# Patient Record
Sex: Male | Born: 1945
Health system: Southern US, Community
[De-identification: ages and names within clinical notes are randomized; demographics above are authoritative.]

## PROBLEM LIST (undated history)

## (undated) DIAGNOSIS — E119 Type 2 diabetes mellitus without complications: Secondary | ICD-10-CM

## (undated) DIAGNOSIS — I680 Cerebral amyloid angiopathy: Secondary | ICD-10-CM

## (undated) DIAGNOSIS — E7800 Pure hypercholesterolemia, unspecified: Secondary | ICD-10-CM

## (undated) DIAGNOSIS — I10 Essential (primary) hypertension: Secondary | ICD-10-CM

## (undated) HISTORY — PX: NASAL SINUS SURGERY: SHX719

## (undated) HISTORY — PX: HERNIA REPAIR: SHX51

## (undated) HISTORY — DX: Cerebral amyloid angiopathy: I68.0

## (undated) HISTORY — PX: COLONOSCOPY: SHX174

---

## 1998-09-03 ENCOUNTER — Encounter: Admission: RE | Admit: 1998-09-03 | Discharge: 1998-12-02 | Payer: Self-pay | Admitting: *Deleted

## 2001-10-07 ENCOUNTER — Encounter: Payer: Self-pay | Admitting: Emergency Medicine

## 2001-10-07 ENCOUNTER — Inpatient Hospital Stay (HOSPITAL_COMMUNITY): Admission: EM | Admit: 2001-10-07 | Discharge: 2001-10-10 | Payer: Self-pay | Admitting: Emergency Medicine

## 2001-10-09 ENCOUNTER — Encounter: Payer: Self-pay | Admitting: Family Medicine

## 2001-10-09 ENCOUNTER — Encounter: Payer: Self-pay | Admitting: Neurology

## 2002-04-08 ENCOUNTER — Emergency Department (HOSPITAL_COMMUNITY): Admission: EM | Admit: 2002-04-08 | Discharge: 2002-04-08 | Payer: Self-pay | Admitting: *Deleted

## 2002-04-08 ENCOUNTER — Encounter: Payer: Self-pay | Admitting: *Deleted

## 2002-06-26 ENCOUNTER — Ambulatory Visit (HOSPITAL_COMMUNITY): Admission: RE | Admit: 2002-06-26 | Discharge: 2002-06-26 | Payer: Self-pay | Admitting: General Surgery

## 2003-12-01 ENCOUNTER — Emergency Department (HOSPITAL_COMMUNITY): Admission: EM | Admit: 2003-12-01 | Discharge: 2003-12-01 | Payer: Self-pay | Admitting: Emergency Medicine

## 2006-12-28 ENCOUNTER — Emergency Department (HOSPITAL_COMMUNITY): Admission: EM | Admit: 2006-12-28 | Discharge: 2006-12-28 | Payer: Self-pay | Admitting: Emergency Medicine

## 2007-06-19 ENCOUNTER — Ambulatory Visit (HOSPITAL_COMMUNITY): Admission: RE | Admit: 2007-06-19 | Discharge: 2007-06-19 | Payer: Self-pay | Admitting: Family Medicine

## 2009-07-14 ENCOUNTER — Emergency Department (HOSPITAL_COMMUNITY): Admission: EM | Admit: 2009-07-14 | Discharge: 2009-07-14 | Payer: Self-pay | Admitting: Emergency Medicine

## 2009-09-23 ENCOUNTER — Encounter: Admission: RE | Admit: 2009-09-23 | Discharge: 2009-09-23 | Payer: Self-pay | Admitting: Endocrinology

## 2010-11-03 LAB — BASIC METABOLIC PANEL
CO2: 27 mEq/L (ref 19–32)
Creatinine, Ser: 0.99 mg/dL (ref 0.4–1.5)
GFR calc Af Amer: >60 mL/min (ref >60)
Glucose, Bld: 117 mg/dL — ABNORMAL HIGH (ref 70–99)
Potassium: 3.8 mEq/L (ref 3.5–5.1)

## 2010-11-03 LAB — DIFFERENTIAL
Basophils Relative: 0 % (ref 0–1)
Eosinophils Absolute: 0 10*3/uL (ref 0.0–0.7)
Lymphs Abs: 0.8 10*3/uL (ref 0.7–4.0)

## 2010-11-03 LAB — URINALYSIS, ROUTINE W REFLEX MICROSCOPIC
Bilirubin Urine: NEGATIVE
Ketones, ur: NEGATIVE mg/dL
Protein, ur: NEGATIVE mg/dL
Urobilinogen, UA: 0.2 mg/dL (ref 0.0–1.0)
pH: 6.5 (ref 5.0–8.0)

## 2010-11-03 LAB — CBC
MCHC: 34 g/dL (ref 30.0–36.0)
MCV: 91.4 fL (ref 78.0–100.0)
RBC: 4.57 MIL/uL (ref 4.22–5.81)
WBC: 14.2 10*3/uL — ABNORMAL HIGH (ref 4.0–10.5)

## 2010-12-18 NOTE — H&P (Signed)
   NAME:  Scott Garner, Scott Garner NO.:  [id number]   MEDICAL RECORD NO.:  [id number]                  PATIENT TYPE:   LOCATION:                                       FACILITY:   PHYSICIAN:  Dalia Heading, M.D.               DATE OF BIRTH:  1946-04-22   DATE OF ADMISSION:  06/26/2002  DATE OF DISCHARGE:                                HISTORY & PHYSICAL   CHIEF COMPLAINT:  Need for screening colonoscopy.   HISTORY OF PRESENT ILLNESS:  The patient is a 65 year old white male who was  referred for a screening colonoscopy.  He denies any symptoms or problems.  He has never had a colonoscopy.  There is no immediate family history of  colon carcinoma.   PAST MEDICAL HISTORY:  1. Insulin-dependent diabetes mellitus.  2. Hypertension.   PAST SURGICAL HISTORY:  Sinus surgery in the remote past.   CURRENT MEDICATIONS:  Insulin injections, Altace, hydrochlorothiazide, baby  aspirin, vitamin.   ALLERGIES:  BEXTRA.   REVIEW OF SYSTEMS:  The patient denies drinking or smoking.  Denies any  other cardiopulmonary difficulties or bleeding disorders.   PHYSICAL EXAMINATION:  GENERAL:  The patient is a well-developed, well-  nourished white male in no acute distress.  VITAL SIGNS:  He is afebrile and vital signs are stable.  LUNGS:  Clear to auscultation with equal breath sounds bilaterally.  HEART:  Reveals a regular rate and rhythm without S3, S4, or murmurs.  ABDOMEN:  Soft, nontender, nondistended.  No hepatosplenomegaly or masses  are noted.  RECTAL:  Examination was deferred to the procedure.   IMPRESSION:  Need for screening colonoscopy.    PLAN:  The patient is scheduled for a colonoscopy on June 26, 2002.  The  risks and benefits of the procedure including bleeding and perforation were  fully explained to the patient who gave informed consent.                                               Dalia Heading, M.D.    MAJ/MEDQ  D:  06/21/2002  T:   06/21/2002  Job:  [id number]   cc:   Patrica Duel, M.D.  [address], [address]  [place]  [place] [address]  Fax: [phone]

## 2010-12-18 NOTE — Discharge Summary (Signed)
[facility]  Patient:    TARAY, NORMOYLE Visit Number: [id number] MRN: [id number]          Service Type: MED Location: 2A A216 01 Attending Physician:  Kirk Ruths Dictated by:   Karleen Hampshire, M.D. Admit Date:  10/07/2001 Discharge Date: 10/10/2001                             Discharge Summary  DISCHARGE DIAGNOSIS: 1. Transient ischemic attack. 2. Insulin-dependent diabetes mellitus.  HOSPITAL COURSE:  This 65 year old insulin-dependent diabetic who has been very tightly controlled was admitted to the emergency room.  The patient having had a second episode of awakening with a headache and having numbness and phoresis of his right upper and lower extremities with also, some slurred speech.  On the morning of admission he could not walk and he was brought to the emergency room.  Symptoms resolved while in the emergency room with a full neurological recovery.  The patients blood sugars were well controlled.  HIs admission labs showed some normal blood gases and hemoglobin 14.9, PT and PTT were normal.  Sodium was 129, potassium normal at 4.1, BUN and creatinine normal.  The patient due to a second episode was admitted to the floor. Plavix was added to his aspirin.  The patient was seen in consultation by Dr. Beryle Beams, from neurology who felt that this was TI, small vessel disease.  The patient also underwent a echocardiogram and carotid Dopplers which were normal.  MRI and MRA of the brain was normal.  The patients lipid profile was quite impressive with cholesterol being 181, HDL 88, and triglycerides 43.  The patient had no more episodes during his stay and was stable at the time of discharge.  A homocystine level was pending.  He is discharged home on his insulin as well as plavix and aspirin.  He will be followed in the office as needed. Dictated by:   Karleen Hampshire, M.D. Attending Physician:  Kirk Ruths DD:   10/10/01 TD:  10/10/01 Job: [id number]

## 2010-12-18 NOTE — Consult Note (Signed)
[facility]  Patient:    Scott Garner, Scott Garner Visit Number: [id number] MRN: [id number]          Service Type: MED Location: 2A A216 01 Attending Physician:  Hilario Quarry Dictated by:   Beryle Beams, M.D. Proc. Date: 10/08/01 Admit Date:  10/07/2001                            Consultation Report  DISCHARGE DIAGNOSIS: 1. Transient ischemic attack. 2. Likely obstructive sleep syndrome.  The symptomatology seems most consistent with a small vessel lacunar type type TIA, however, the headaches that he has been having is concerning and suggests a large vessel process.  RECOMMENDATION: 1. I agree with the current plans including aspirin and plavix, antiplatelet    combination, echocardiography and carotid duplex Doppler. 2. I would add an MRI/MRA of the brain to assess particularly for intracranial    disease. 3. Would consider ______ use regardless of what his course is. 4. Homocystine level. 5. Consider diagnostic polysomnography.  BRIEF HISTORY:  This is a 65 year old right handed Caucasian gentleman who has a longstanding history of diabetes, and has been treated for diabetes for close to 3 years.  He developed headaches for the past 2 weeks that were quite severe, located in the right periorbital region, not associated with any particular symptoms.  He denies nausea or vomiting or flashing lights.  He does report having episodic black dots.  About 4 days ago he developed acute right sided hemiparesis that was transient and lasted about 15 to 20 minutes. The hemiparesis was moderate in nature and not associated with slurred speech. He had a more severe case of transient right-sided hemiparesis 2 days ago.  He was apparently totally paralyzed on that side.  This lasted for approximately an hour this time associated with marked slurred speech.  Apparently when he has his episodic headaches recently he has had difficulty concentrating and difficulty  saying what he wants to say.  This history is corroborated by his daughter who works with him.  PAST MEDICAL HISTORY:  As stated above.  He has no prior medicals.  No recent surgeries but apparently has had a herniorrhaphy in the past.  MEDICATIONS:  Insulin.  REVIEW OF SYSTEMS:  Denies chest pain, nausea, vomiting, diaphoresis.  Other review of systems are in the history of present illness.  He does endorse a history of significant snoring.  FAMILY HISTORY:  Significant for a father with CVA.  PHYSICAL EXAMINATION:  GENERAL:  A moderately overweight gentleman.  He is no acute distress.  HEENT/NECK:  Short stocky neck.  He has significantly reduced ______ space and a large tongue.  EXTREMITIES:  No edema.  ABDOMEN:  Soft.  NEUROLOGIC:  He is alert and oriented.  Speech, language and cognition are intact.  Cranial nerves II-XII intact including the visual fields and motor examination shows normal tone and bulk and strength.  There is no pronator drift.  Coordination is intact.  Reflexes are diminished throughout.  Toes are equivocal on the right, and downgoing on the left.  Sensory examination somewhat diminished sensation in the toes.  Sensation from side to side is symmetric.  There is no extinction on double simultaneous stimulation.  Gait is normal.  Gait is normal. Dictated by:   Beryle Beams, M.D. Attending Physician:  Hilario Quarry DD:  10/08/01 TD:  10/09/01 Job: [id number]

## 2011-08-18 DIAGNOSIS — E7800 Pure hypercholesterolemia, unspecified: Secondary | ICD-10-CM | POA: Diagnosis not present

## 2011-08-18 DIAGNOSIS — E109 Type 1 diabetes mellitus without complications: Secondary | ICD-10-CM | POA: Diagnosis not present

## 2011-08-18 DIAGNOSIS — I10 Essential (primary) hypertension: Secondary | ICD-10-CM | POA: Diagnosis not present

## 2012-01-20 DIAGNOSIS — R21 Rash and other nonspecific skin eruption: Secondary | ICD-10-CM | POA: Diagnosis not present

## 2012-01-20 DIAGNOSIS — L259 Unspecified contact dermatitis, unspecified cause: Secondary | ICD-10-CM | POA: Diagnosis not present

## 2012-01-20 DIAGNOSIS — Z6825 Body mass index (BMI) 25.0-25.9, adult: Secondary | ICD-10-CM | POA: Diagnosis not present

## 2012-02-15 DIAGNOSIS — E7800 Pure hypercholesterolemia, unspecified: Secondary | ICD-10-CM | POA: Diagnosis not present

## 2012-02-15 DIAGNOSIS — E109 Type 1 diabetes mellitus without complications: Secondary | ICD-10-CM | POA: Diagnosis not present

## 2012-02-17 DIAGNOSIS — E871 Hypo-osmolality and hyponatremia: Secondary | ICD-10-CM | POA: Diagnosis not present

## 2012-02-17 DIAGNOSIS — E7800 Pure hypercholesterolemia, unspecified: Secondary | ICD-10-CM | POA: Diagnosis not present

## 2012-02-17 DIAGNOSIS — I10 Essential (primary) hypertension: Secondary | ICD-10-CM | POA: Diagnosis not present

## 2012-02-17 DIAGNOSIS — E109 Type 1 diabetes mellitus without complications: Secondary | ICD-10-CM | POA: Diagnosis not present

## 2012-03-21 DIAGNOSIS — E7800 Pure hypercholesterolemia, unspecified: Secondary | ICD-10-CM | POA: Diagnosis not present

## 2012-03-21 DIAGNOSIS — E871 Hypo-osmolality and hyponatremia: Secondary | ICD-10-CM | POA: Diagnosis not present

## 2012-03-21 DIAGNOSIS — E109 Type 1 diabetes mellitus without complications: Secondary | ICD-10-CM | POA: Diagnosis not present

## 2012-04-04 ENCOUNTER — Other Ambulatory Visit (HOSPITAL_COMMUNITY): Payer: Self-pay | Admitting: Family Medicine

## 2012-04-04 ENCOUNTER — Ambulatory Visit (HOSPITAL_COMMUNITY)
Admission: RE | Admit: 2012-04-04 | Discharge: 2012-04-04 | Disposition: A | Discharge status: Home / Self Care | Payer: Medicare Other | Source: Ambulatory Visit | Attending: Family Medicine | Admitting: Family Medicine

## 2012-04-04 DIAGNOSIS — M542 Cervicalgia: Secondary | ICD-10-CM

## 2012-04-04 DIAGNOSIS — M5030 Other cervical disc degeneration, unspecified cervical region: Secondary | ICD-10-CM | POA: Insufficient documentation

## 2012-04-04 DIAGNOSIS — R200 Anesthesia of skin: Secondary | ICD-10-CM

## 2012-04-04 DIAGNOSIS — M5412 Radiculopathy, cervical region: Secondary | ICD-10-CM

## 2012-04-06 ENCOUNTER — Ambulatory Visit (HOSPITAL_COMMUNITY)
Admission: RE | Admit: 2012-04-06 | Discharge: 2012-04-06 | Disposition: A | Discharge status: Home / Self Care | Payer: Medicare Other | Source: Ambulatory Visit | Attending: Family Medicine | Admitting: Family Medicine

## 2012-04-06 DIAGNOSIS — R209 Unspecified disturbances of skin sensation: Secondary | ICD-10-CM | POA: Diagnosis not present

## 2012-04-06 DIAGNOSIS — M47812 Spondylosis without myelopathy or radiculopathy, cervical region: Secondary | ICD-10-CM | POA: Diagnosis not present

## 2012-04-06 DIAGNOSIS — M542 Cervicalgia: Secondary | ICD-10-CM | POA: Diagnosis not present

## 2012-04-06 DIAGNOSIS — M4802 Spinal stenosis, cervical region: Secondary | ICD-10-CM | POA: Diagnosis not present

## 2012-04-06 DIAGNOSIS — M25519 Pain in unspecified shoulder: Secondary | ICD-10-CM | POA: Insufficient documentation

## 2012-04-06 DIAGNOSIS — R200 Anesthesia of skin: Secondary | ICD-10-CM

## 2012-04-06 DIAGNOSIS — M5412 Radiculopathy, cervical region: Secondary | ICD-10-CM

## 2012-04-15 DIAGNOSIS — Z6826 Body mass index (BMI) 26.0-26.9, adult: Secondary | ICD-10-CM | POA: Diagnosis not present

## 2012-04-15 DIAGNOSIS — I10 Essential (primary) hypertension: Secondary | ICD-10-CM | POA: Diagnosis not present

## 2012-04-15 DIAGNOSIS — H6690 Otitis media, unspecified, unspecified ear: Secondary | ICD-10-CM | POA: Diagnosis not present

## 2012-04-18 DIAGNOSIS — M542 Cervicalgia: Secondary | ICD-10-CM | POA: Diagnosis not present

## 2012-04-18 DIAGNOSIS — M47812 Spondylosis without myelopathy or radiculopathy, cervical region: Secondary | ICD-10-CM | POA: Diagnosis not present

## 2012-04-21 ENCOUNTER — Ambulatory Visit (HOSPITAL_COMMUNITY)
Admission: RE | Admit: 2012-04-21 | Discharge: 2012-04-21 | Disposition: A | Discharge status: Home / Self Care | Payer: Medicare Other | Source: Ambulatory Visit | Attending: Neurosurgery | Admitting: Neurosurgery

## 2012-04-21 DIAGNOSIS — IMO0001 Reserved for inherently not codable concepts without codable children: Secondary | ICD-10-CM | POA: Insufficient documentation

## 2012-04-21 DIAGNOSIS — M6281 Muscle weakness (generalized): Secondary | ICD-10-CM | POA: Diagnosis not present

## 2012-04-21 DIAGNOSIS — M2560 Stiffness of unspecified joint, not elsewhere classified: Secondary | ICD-10-CM | POA: Insufficient documentation

## 2012-04-21 DIAGNOSIS — M542 Cervicalgia: Secondary | ICD-10-CM | POA: Insufficient documentation

## 2012-04-21 DIAGNOSIS — M2569 Stiffness of other specified joint, not elsewhere classified: Secondary | ICD-10-CM | POA: Insufficient documentation

## 2012-04-21 NOTE — Evaluation (Cosign Needed)
Physical Therapy Evaluation  Patient Details  Name: Scott Garner MRN: [id number] Date of Birth: 05-08-1946  Today's Date: 04/21/2012 Time: 1520-1557 PT Time Calculation (min): 37 min  Visit#: 1  of 12   Re-eval: 05/21/12 Assessment Diagnosis: cervical spondylosis Next MD Visit: 05/29/2012 Prior Therapy: none  Authorization: medicare    Authorization Visit#: 1  of 10    Past Medical History: No past medical history on file. Past Surgical History: No past surgical history on file.  Subjective Symptoms/Limitations Symptoms: Mr. Fairley states that he has had no injury of trauma.  His neck began to bother him on the left side. He states that it is difficult to raise his head up if he bends it forward.  The patient states that this has been going on for several months and is slowly progressing in nature.  The patient denies radicular pain. 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  Cognition/Observation Cognition Overall Cognitive Status: Appears within functional limits for tasks assessed  Sensation/Coordination/Flexibility/Functional Tests Functional Tests Functional Tests: neck disability 12/50  Assessment Cervical AROM Cervical Flexion: wnl increased pain with return  Cervical Extension: wnl reps improve pain Cervical - Right Side Bend: wnl reps no change  Cervical - Left Side Bend: wnl reps increase pain Cervical - Right Rotation: decreased 30% Cervical - Left Rotation: decrased 305 Cervical Strength Cervical Extension: 4/5 Cervical - Right Side Bend: 4/5 Cervical - Left Side Bend: 4/5 Palpation Palpation: mm spasm mid trap area decreased  with massage.  Exercise/Treatments Mobility/Balance  Posture/Postural Control Posture/Postural Control: Postural limitations Postural Limitations: rounded shoulder slight forward head   Seated Exercises Cervical Isometrics: Extension;Right lateral flexion;Left lateral flexion;5 reps Neck Retraction: 5 reps Lateral Flexion: 5 reps Shoulder Shrugs: 5 reps;Limitations Shoulder Shrugs Limitations: up/back relax    Manual Therapy Manual Therapy: Massage Massage: STM to cervical and mid trap area.  MM spasm noted mid trap lateral to C6-7   Physical Therapy Assessment and Plan PT Assessment and Plan Clinical Impression Statement: Pt with decreased ROM, pain and slight postural changes who will benefit from skilled PT to educate in proper exercises and importance of posture in spinal care. Pt will benefit from skilled therapeutic intervention in order to improve on the following deficits: Decreased range of motion;Decreased strength;Increased muscle spasms;Pain Rehab Potential: Good PT Frequency: Min 2X/week PT Duration: 6 weeks PT Treatment/Interventions: Therapeutic exercise;Therapeutic activities;Modalities;Manual techniques PT Plan: Begin backward UBE/ T-band ex and HMP with cervical traction with Max 17 min 12  x 12 minutes    Goals Home Exercise Program Pt will Perform Home Exercise Program: Independently PT Short Term Goals Time to Complete Short Term Goals: 2 weeks PT Short Term Goal 1: FROM to have safer driving PT Short Term Goal 2: Pain to be no greater than a 4 PT Long Term Goals Time to Complete Long Term Goals: 4 weeks PT Long Term Goal 1: I in advance HEP PT Long Term Goal 2: pain to be no greater than a one 80 % of the time Long Term Goal 3: Strength to be wnl Long Term Goal 4: Pt to be able to read for an hour without discomfort  Problem List Patient Active Problem List  Diagnosis  . Neck pain  on left side  . Stiffness of joints, not elsewhere classified,  multiple sites    PT - End of Session Activity Tolerance: Patient tolerated treatment well General Behavior During Session: [facility] for tasks performed Cognition: [facility] for tasks performed PT Plan of Care PT Home Exercise Plan: given  GP Functional Assessment Tool Used: neck disability Functional Limitation: Changing and maintaining body position Changing and Maintaining Body Position Current Status (Z6109): At least 20 percent but less than 40 percent impaired, limited or restricted Changing and Maintaining Body Position Goal Status (U0454): 0 percent impaired, limited or restricted  Finnian Husted,CINDY 04/21/2012, 4:13 PM  Physician Documentation Your signature is required to indicate approval of the treatment plan as stated above.  Please sign and either send electronically or make a copy of this report for your files and return this physician signed original.   Please mark one 1.__approve of plan  2. ___approve of plan with the following conditions.   ______________________________                                                          _____________________ Physician Signature                                                                                                             Date

## 2012-04-26 ENCOUNTER — Ambulatory Visit (HOSPITAL_COMMUNITY)
Admission: RE | Admit: 2012-04-26 | Discharge: 2012-04-26 | Disposition: A | Discharge status: Home / Self Care | Payer: Medicare Other | Source: Ambulatory Visit | Attending: Family Medicine | Admitting: Family Medicine

## 2012-04-26 DIAGNOSIS — IMO0001 Reserved for inherently not codable concepts without codable children: Secondary | ICD-10-CM | POA: Diagnosis not present

## 2012-04-26 DIAGNOSIS — M542 Cervicalgia: Secondary | ICD-10-CM | POA: Diagnosis not present

## 2012-04-26 DIAGNOSIS — M6281 Muscle weakness (generalized): Secondary | ICD-10-CM | POA: Diagnosis not present

## 2012-04-26 NOTE — Progress Notes (Signed)
Physical Therapy Treatment Patient Details  Name: Scott Garner MRN: [id number] Date of Birth: May 14, 1946  Today's Date: 04/26/2012 Time: 1324-4010 PT Time Calculation (min): 42 min Charges:  therex 24', traction X 1 unit, MHP X 1 unit  Visit#: 2  of 12   Re-eval: 05/19/12    Authorization: Medicare  Authorization Visit#: 2  of 10    Subjective: Symptoms/Limitations Symptoms: PT. states he can tel it's a little better and states the exercises are helping and is compliant with them.  Reports higher pain when initially wakes in the morning but does his exercises and takes 1/2 pain pill to ease it.  Currently with 3/10 pain. Pain Assessment Currently in Pain?: Yes Pain Score:   3 Pain Location: Neck   Exercise/Treatments Machines for Strengthening UBE (Upper Arm Bike): 4' backward Theraband Exercises Scapula Retraction: 10 reps;Green Shoulder Extension: 10 reps;Green Rows: 10 reps;Green Seated Exercises Cervical Isometrics: Extension;Right lateral flexion;Left lateral flexion;5 reps Neck Retraction: 10 reps Lateral Flexion: 5 reps Shoulder Shrugs: 5 reps;Limitations Shoulder Shrugs Limitations: up/back relax   Modalities Modalities: Moist Heat;Traction Moist Heat Therapy Number Minutes Moist Heat: 15 Minutes Moist Heat Location:  (cervical) Traction Type of Traction: Cervical Min (lbs): 12 Max (lbs): 17 Hold Time: 60 Rest Time: 15 Time: 15  Physical Therapy Assessment and Plan PT Assessment and Plan Clinical Impression Statement: Added UBE and postural 3 theraband exercises to help increase postural strength. Pt. required multimodal cues for form and stabilization with tband and will require reinforcement before giving for HEP. Pt. able to perform other exercises without diffiuculty and with good form. Began cervical traction per PT POC with MHP PT Plan: Assess effectiveness of traction next visit and increase weight if needed. Progress strengthening exercises and  add corner stretch for chest.     Problem List Patient Active Problem List  Diagnosis  . Neck pain on left side  . Stiffness of joints, not elsewhere classified, multiple sites    PT - End of Session Activity Tolerance: Patient tolerated treatment well General Behavior During Session: [facility] for tasks performed Cognition: [facility] for tasks performed   Lurena Nida, PTA/CLT 04/26/2012, 9:20 AM

## 2012-04-28 ENCOUNTER — Ambulatory Visit (HOSPITAL_COMMUNITY)
Admission: RE | Admit: 2012-04-28 | Discharge: 2012-04-28 | Disposition: A | Discharge status: Home / Self Care | Payer: Medicare Other | Source: Ambulatory Visit | Attending: Family Medicine | Admitting: Family Medicine

## 2012-04-28 DIAGNOSIS — M542 Cervicalgia: Secondary | ICD-10-CM | POA: Diagnosis not present

## 2012-04-28 DIAGNOSIS — M2560 Stiffness of unspecified joint, not elsewhere classified: Secondary | ICD-10-CM

## 2012-04-28 DIAGNOSIS — IMO0001 Reserved for inherently not codable concepts without codable children: Secondary | ICD-10-CM | POA: Diagnosis not present

## 2012-04-28 DIAGNOSIS — M6281 Muscle weakness (generalized): Secondary | ICD-10-CM | POA: Diagnosis not present

## 2012-04-28 NOTE — Progress Notes (Signed)
Physical Therapy Treatment Patient Details  Name: Scott Garner MRN: [id number] Date of Birth: 07-06-1946  Today's Date: 04/28/2012 Time: 1015-1055 PT Time Calculation (min): 40 min  Visit#: 3  of 12   Re-eval: 05/19/12    Authorization: Medicare  Authorization Visit#: 3  of 10    Subjective: Symptoms/Limitations Symptoms: Pt states he was sore after traction but it made him feel better overall.  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 Pain Assessment Currently in Pain?: Yes Pain Score:   2 Pain Location: Neck Pain Orientation: Left    Exercise/Treatments     Machines for Strengthening UBE (Upper Arm Bike): 4' backward Theraband Exercises Scapula Retraction: 10 reps;Green Shoulder Extension: 10 reps;Green Rows: 10 reps;Green Standing Exercises Other Standing Exercises: chest stretch x 10 Other Standing Exercises: wall arch x 5 Seated Exercises Lateral Flexion: 5 reps X to V: 10 reps W Back: 10 reps;Weight W Back Weights (lbs): 3  Modalities Modalities: Moist Heat;Traction Moist Heat Therapy Number Minutes Moist Heat: 15 Minutes Moist Heat Location:  (upper back) Traction Type of Traction: Cervical Min (lbs): 15 Max (lbs): 20 Hold Time: 60 Rest Time: 15 Time: 12  Physical Therapy Assessment and Plan PT Assessment and Plan Clinical Impression Statement: Pt verbally cued to keep chin tucked during new ex;  Completed with good form.  Pt shown towel roll in pillow to decrease pain at night.   PT Frequency: Min 2X/week PT Plan: begin chest stretch; money and prone axial lift next visit.  Check to see if towel roll was effective.    Goals    Problem List Patient Active Problem List  Diagnosis  . Neck pain on left side  . Stiffness of joints, not elsewhere classified, multiple sites    PT Plan of Care PT Home Exercise Plan: new given to pt   GP    Lekesha Claw,CINDY 04/28/2012, 10:51 AM

## 2012-05-02 ENCOUNTER — Ambulatory Visit (HOSPITAL_COMMUNITY)
Admission: RE | Admit: 2012-05-02 | Discharge: 2012-05-02 | Disposition: A | Discharge status: Home / Self Care | Payer: Medicare Other | Source: Ambulatory Visit | Attending: Neurosurgery | Admitting: Neurosurgery

## 2012-05-02 DIAGNOSIS — M6281 Muscle weakness (generalized): Secondary | ICD-10-CM | POA: Insufficient documentation

## 2012-05-02 DIAGNOSIS — M542 Cervicalgia: Secondary | ICD-10-CM | POA: Diagnosis not present

## 2012-05-02 DIAGNOSIS — M2569 Stiffness of other specified joint, not elsewhere classified: Secondary | ICD-10-CM | POA: Diagnosis not present

## 2012-05-02 DIAGNOSIS — IMO0001 Reserved for inherently not codable concepts without codable children: Secondary | ICD-10-CM | POA: Insufficient documentation

## 2012-05-02 NOTE — Progress Notes (Addendum)
Physical Therapy Treatment Patient Details  Name: Scott Garner MRN: [id number] Date of Birth: Aug 30, 1945  Today's Date: 05/02/2012 Time: 4098-1191 PT Time Calculation (min): 50 min  Visit#: 4  of 12   Re-eval: 05/19/12 Charges: Therex x 25' Traction w/MHP x 15'   Authorization: Medicare   Authorization Visit#: 4  of 10    Subjective: Symptoms/Limitations Symptoms: Pt states that he is having some soreness and tension but no pain. Pain Assessment Currently in Pain?: No/denies   Exercise/Treatments Stretches Levator Stretch: 1 rep;30 seconds Machines for Strengthening UBE (Upper Arm Bike): 5' backward Theraband Exercises Scapula Retraction: 10 reps;Green Shoulder Extension: 10 reps;Green Rows: 10 reps;Green Seated Exercises Lateral Flexion: 10 reps X to V: 10 reps W Back: 10 reps;Weight W Back Weights (lbs): 3 Money: 10 reps   Modalities  Modalities: Moist Heat;Traction  Moist Heat Therapy  Number Minutes Moist Heat: 15 Minutes  Moist Heat Location: (upper back)  Traction  Type of Traction: Cervical  Min (lbs): 15  Max (lbs): 20  Hold Time: 60  Rest Time: 15  Time: 12  Physical Therapy Assessment and Plan PT Assessment and Plan Clinical Impression Statement: Pt requires cueing to avoid forward head posture with scapular strengthening exercises. Pt reports that he attempted towel roll but it was too big and it was uncomfortable. He states that he will try a smaller towel. Traction completed again this session secondary to decreased pain after last session. PT Plan: Continue to progress cervical strength/ROM and decrease pain per PT POC.     Problem List Patient Active Problem List  Diagnosis  . Neck pain on left side  . Stiffness of joints, not elsewhere classified, multiple sites    PT - End of Session Activity Tolerance: Patient tolerated treatment well General Behavior During Session: [facility] for tasks performed Cognition: [facility] for tasks  performed  Seth Bake, PTA 05/02/2012, 10:13 AM

## 2012-05-04 ENCOUNTER — Ambulatory Visit (HOSPITAL_COMMUNITY)
Admission: RE | Admit: 2012-05-04 | Discharge: 2012-05-04 | Disposition: A | Discharge status: Home / Self Care | Payer: Medicare Other | Source: Ambulatory Visit | Attending: Neurosurgery | Admitting: Neurosurgery

## 2012-05-04 DIAGNOSIS — M542 Cervicalgia: Secondary | ICD-10-CM

## 2012-05-04 DIAGNOSIS — M2560 Stiffness of unspecified joint, not elsewhere classified: Secondary | ICD-10-CM

## 2012-05-04 NOTE — Progress Notes (Signed)
Physical Therapy Treatment Patient Details  Name: Scott Garner MRN: [id number] Date of Birth: 21-Mar-1946  Today's Date: 05/04/2012 Time: 0800-0847 PT Time Calculation (min): 47 min  Visit#: 5  of 12   Re-eval: 05/19/12    Authorization: medicare  Authorization Time Period:    Authorization Visit#: 5  of 10    Subjective: Symptoms/Limitations Symptoms: I'm still a little sore.  Im doing all the exercises. Pain is more soreness between a 1 and 2  Exercise/Treatments     Stretches Levator Stretch: 1 rep;30 seconds Machines for Strengthening UBE (Upper Arm Bike): 5' backward Theraband Exercises Scapula Retraction: 15 reps;Green Shoulder Extension: 15 reps;Green Rows: 15 reps;Green Standing Exercises Wall Push Ups: 10 reps Upper Extremity Flexion with Stabilization: Flexion;10 reps Seated Exercises Lateral Flexion: 10 reps X to V: 10 reps W Back: 10 reps;Weight W Back Weights (lbs): 3 Money: 10 reps    Prone Exercises Axial Exentsion: 5 reps W Back: 10 reps Shoulder Extension: 10 reps   Manual Therapy Manual Therapy: Myofascial release Massage: STM with manual traction and jt mobs  Physical Therapy Assessment and Plan PT Assessment and Plan Clinical Impression Statement: Pt continues to need verbal cuing with advance ex to keep head erect; worked on standing posture.  PT had manual traction today to allow jt mob and massage. PT Plan: access if manual or mechanical works better for patient .    Goals    Problem List Patient Active Problem List  Diagnosis  . Neck pain on left side  . Stiffness of joints, not elsewhere classified, multiple sites    PT - End of Session Activity Tolerance: Patient tolerated treatment well General Behavior During Session: [facility] for tasks performed Cognition: [facility] for tasks performed  GP    Daria Mcmeekin,CINDY 05/04/2012, 9:53 AM

## 2012-05-09 ENCOUNTER — Ambulatory Visit (HOSPITAL_COMMUNITY)
Admission: RE | Admit: 2012-05-09 | Discharge: 2012-05-09 | Disposition: A | Discharge status: Home / Self Care | Payer: Medicare Other | Source: Ambulatory Visit | Attending: Neurosurgery | Admitting: Neurosurgery

## 2012-05-09 NOTE — Progress Notes (Signed)
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 and was very sore yesterday. Pain Assessment Pain Score:   2 Pain Location: Neck Pain Orientation: Left Pain Type: Chronic pain   Exercise/Treatments   Machines for Strengthening UBE (Upper Arm Bike): 4' at 2.0 Theraband Exercises Scapula Retraction: 10 reps;Green Shoulder Extension: 10 reps;Green Rows: 10 reps;Green Standing Exercises Wall Push Ups: 10 reps Upper Extremity Flexion with Stabilization: Flexion;10 reps;Limitations UE Flexion with Stabilization Limitations: 2# Other Standing Exercises: chest stretch x 10   Prone Exercises Axial Exentsion: 10 reps W Back: 15 reps;Weight W Back Weights (lbs): 2 Shoulder Extension: 15 reps;Weights Shoulder Extension Weights (lbs): 2 Rows: 15 reps;Weights Rows Weights (lbs): 2    Modalities Modalities: Moist Heat Manual Therapy Manual Therapy: Massage Massage: STM with manual traction and Jt mobs Moist Heat Therapy Number Minutes Moist Heat: 15 Minutes Moist Heat Location:  (upper back to relax)  Physical Therapy Assessment and Plan PT Assessment and Plan Clinical Impression Statement: Pt states manual felt better but felt more relaxed w/ HMP; will work manual with HMP.  Pt had mm spasm along mid L trap that was able to be obliterated with spasm.  Pt pain free at end of treatment. PT Plan: assess if weights affected pt.  begin prone hoizontal abduction next treatment.    Goals    Problem List Patient Active Problem List  Diagnosis  . Neck pain on left side  . Stiffness of joints, not elsewhere classified, multiple sites    PT - End of  Session Activity Tolerance: Patient tolerated treatment well General Behavior During Session: [facility] for tasks performed Cognition: [facility] for tasks performed  GP Functional Assessment Tool Used: neck disability  RUSSELL,CINDY 05/09/2012, 10:18 AM

## 2012-05-11 ENCOUNTER — Ambulatory Visit (HOSPITAL_COMMUNITY)
Admission: RE | Admit: 2012-05-11 | Discharge: 2012-05-11 | Disposition: A | Discharge status: Home / Self Care | Payer: Medicare Other | Source: Ambulatory Visit | Attending: Neurosurgery | Admitting: Neurosurgery

## 2012-05-11 NOTE — Progress Notes (Signed)
Physical Therapy Treatment Patient Details  Name: Scott Garner MRN: [id number] Date of Birth: [birth year]  Today's Date: 05/11/2012 Time: 0802-0855 PT Time Calculation (min): 53 min  Visit#: 7  of 12   Re-eval: 05/19/12 Charges: Therex x 28' Manual x 10' MHP x 1  Authorization: medicare  Authorization Visit#: 7  of 10    Subjective: Symptoms/Limitations Symptoms: I felt really good yesterday but I'm a little sore today though. Pain Assessment Currently in Pain?: Yes Pain Score:   2   Exercise/Treatments Machines for Strengthening UBE (Upper Arm Bike): 4' at 2.0 Theraband Exercises Scapula Retraction: 10 reps;Green Shoulder Extension: 10 reps;Green Rows: 10 reps;Green Standing Exercises Wall Push Ups: 10 reps Other Standing Exercises: chest stretch x 10 Seated Exercises Neck Retraction: 10 reps;5 secs;Limitations Neck Retraction Limitations: isometric against wall Prone Exercises W Back: 15 reps;Weight W Back Weights (lbs): 2 Shoulder Extension: 15 reps;Weights Shoulder Extension Weights (lbs): 2 Rows: 15 reps;Weights Rows Weights (lbs): 2 Other Prone Exercise: Horizontal abd w/2# x 15  Modalities Modalities: Moist Heat Manual Therapy Manual Therapy: Massage Massage: STM with manual traction  Moist Heat Therapy Number Minutes Moist Heat: 20 Minutes (10' before manual 10' during) Moist Heat Location:  (upper back to relax)  Physical Therapy Assessment and Plan PT Assessment and Plan Clinical Impression Statement: Pt required min cueing throughout session to avoid forward head. Manual traction and STM completed to cervical region with MHP to decrease pain and tightness. Pt reports soreness decrease to 1/10 at end of session. PT Plan: Continue to progress postural strength and decrease pain per PT POC.     Problem List Patient Active Problem List  Diagnosis  . Neck pain on left side  . Stiffness of joints, not elsewhere classified, multiple sites    PT  - End of Session Activity Tolerance: Patient tolerated treatment well General Behavior During Session: [facility] for tasks performed Cognition: [facility] for tasks performed  GP Functional Assessment Tool Used: neck disability  Seth Bake, PTA 05/11/2012, 9:01 AM

## 2012-05-16 ENCOUNTER — Ambulatory Visit (HOSPITAL_COMMUNITY)
Admission: RE | Admit: 2012-05-16 | Discharge: 2012-05-16 | Disposition: A | Discharge status: Home / Self Care | Payer: Medicare Other | Source: Ambulatory Visit | Attending: Neurosurgery | Admitting: Neurosurgery

## 2012-05-16 NOTE — Progress Notes (Signed)
Physical Therapy Treatment Patient Details  Name: Scott Garner MRN: [id number] Date of Birth: 1945-08-17  Today's Date: 05/16/2012 Time: 7829-5621 PT Time Calculation (min): 41 min  Visit#: 8  of 12   Re-eval: 05/19/12 Charges: Therex x 15' Manual x 15' MHP x 20'  Authorization: medicare  Authorization Visit#: 8  of 10    Subjective: Symptoms/Limitations Symptoms: I still have some soreness. Pain Assessment Currently in Pain?: Yes Pain Score: 0-No pain Pain Location: Neck Pain Orientation: Left   Exercise/Treatments Stretches Upper Trapezius Stretch: 2 reps;20 seconds Machines for Strengthening UBE (Upper Arm Bike): 4' at 2.0 Standing Exercises Other Standing Exercises: Neck retraction isometric against wall 10 x 5" Seated Exercises Other Seated Exercise: AROM cervical rotation and SB x 10  W Back: 15 reps;Weight W Back Weights (lbs): 2 Shoulder Extension: 15 reps;Weights Shoulder Extension Weights (lbs): 2 Rows: 15 reps;Weights Rows Weights (lbs): 2 Other Prone Exercise: Horizontal abd w/2# x 15  Manual Therapy Massage: STM with manual traction to cervical region Moist Heat Therapy Number Minutes Moist Heat: 20 Minutes (5' before manual 10' during) Moist Heat Location:  (cerivcal)  Physical Therapy Assessment and Plan PT Assessment and Plan Clinical Impression Statement: Pt completes all therex well after initial demonstration for proper technique. Pt continues to display guarded cervical motions. Manual techniques completed with MHP to cervical region to decrease soreness. Pt reports Pain decrease to 1/10 at end of session. PT Plan: Continue to progress postural strength and decrease pain per PT POC.     Problem List Patient Active Problem List  Diagnosis  . Neck pain on left side  . Stiffness of joints, not elsewhere classified, multiple sites    PT - End of Session Activity Tolerance: Patient tolerated treatment well General Behavior During  Session: [facility] for tasks performed Cognition: [facility] for tasks performed  GP Functional Assessment Tool Used: neck disability  Seth Bake, PTA 05/16/2012, 10:28 AM

## 2012-05-18 ENCOUNTER — Ambulatory Visit (HOSPITAL_COMMUNITY)
Admission: RE | Admit: 2012-05-18 | Discharge: 2012-05-18 | Disposition: A | Discharge status: Home / Self Care | Payer: Medicare Other | Source: Ambulatory Visit | Attending: Neurosurgery | Admitting: Neurosurgery

## 2012-05-18 NOTE — Evaluation (Signed)
Physical Therapy Re-evaluation  Patient Details  Name: KENTRAVIOUS LIPFORD MRN: [id number] Date of Birth: 08-Sep-1945  Today's Date: 05/18/2012 Time: 0803-0845 PT Time Calculation (min): 42 min  Visit#: 9  of 12   Re-eval: 05/18/12 Assessment Diagnosis: cervical spondylosis Next MD Visit: 05/29/2012 Prior Therapy: none  Authorization: medicare- g code changed at visit 9  Authorization Time Period:    Authorization Visit#: 9  of 12    Past Medical History: No past medical history on file. Past Surgical History: No past surgical history on file.  Subjective Symptoms/Limitations Symptoms: Pt states that he mowed yesterday all day long; his neck was stiff but seemed to relax with heat. 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 Leisure: Hobbies-yes (Comment)  Cognition/Observation Cognition Overall Cognitive Status: Appears within functional limits for tasks assessed  Sensation/Coordination/Flexibility/Functional Tests Functional Tests Functional Tests: neck disability 9/50 was 12/50  Assessment Cervical AROM Cervical Flexion: wnl increased pain with return  Cervical Extension: wnl reps improve pain Cervical - Right Side Bend: wnl reps no change  Cervical - Left Side Bend: wnl reps increase pain Cervical - Right Rotation: decreased 10% was decreased 30% Cervical - Left Rotation: decrased 20% was decreased 30% Cervical Strength Cervical Extension: 5/5 Cervical - Right Side Bend: 5/5 (was 4/5) Cervical - Left Side Bend: 5/5 (was 4/5) Palpation Palpation: mm spasm mid trap area decreased with massage.  Exercise/Treatments Mobility/Balance  Posture/Postural Control Posture/Postural Control: Postural limitations Postural Limitations: rounded shoulder slight forward head        Physical Therapy Assessment and Plan PT Assessment and Plan Clinical Impression Statement: Pt has improved but still has slight spasm on L mid trap area.l  Pt is I in ex. Will concentrate last three visits on modalities and manual including PROM for rotation with  contract relax to attempt to to achieve 0/10 pain Rehab Potential: Good PT Plan: Assess how [place] did with paitients pain.  Begin manual traction w/ massage and PROM for rotation contract relax to increase rotation.   Continue therapy 2x wk for three more visits. Goals Home Exercise Program PT Goal: Perform Home Exercise Program - Progress: Met PT Short Term Goals PT Short Term Goal 1: Goal was FROM for safe driving PT Short Term Goal 1 - Progress: Progressing toward goal PT Short Term Goal 2: Goal was for pain to be no greater than a 4 PT Short Term Goal 2 - Progress: Met PT Long Term Goals PT Long Term Goal 1: Goal was to be I in HEP PT Long Term Goal 1 - Progress: Met PT Long Term Goal 2: [place] was for pain no greater than a 1 80% of the time. PT Long Term Goal 2 - Progress: Progressing toward goal Long Term Goal 3: Goal was strength to be wnl Long Term Goal 3 Progress: Met Long Term Goal 4: Pt states that he is able to read for a longer time now but he still is unable to read for a full hour without feeling increased stress on his neck. Long Term Goal 4 Progress: Progressing toward goal  Problem List Patient Active Problem List  Diagnosis  . Neck pain on left side  . Stiffness of joints, not elsewhere classified, multiple sites    PT - End of Session Activity Tolerance: Patient tolerated treatment well  General Behavior During Session: [facility] for tasks performed Cognition: [facility] for tasks performed PT Plan of Care PT Home Exercise Plan: given pt I in  GP Functional Assessment Tool Used: neck disability and therapist assessment Functional Limitation: Changing and maintaining body position Changing and  Maintaining Body Position Current Status (Z6109): At least 1 percent but less than 20 percent impaired, limited or restricted Changing and Maintaining Body Position Goal Status (U0454): 0 percent impaired, limited or restricted  Shareese Macha,CINDY 05/18/2012, 8:50 AM  Physician Documentation Your signature is required to indicate approval of the treatment plan as stated above.  Please sign and either send electronically or make a copy of this report for your files and return this physician signed original.   Please mark one 1.__approve of plan  2. ___approve of plan with the following conditions.   ______________________________                                                          _____________________ Physician Signature                                                                                                             Date

## 2012-05-22 ENCOUNTER — Ambulatory Visit (HOSPITAL_COMMUNITY)
Admission: RE | Admit: 2012-05-22 | Discharge: 2012-05-22 | Disposition: A | Discharge status: Home / Self Care | Payer: Medicare Other | Source: Ambulatory Visit | Attending: Neurosurgery | Admitting: Neurosurgery

## 2012-05-22 NOTE — Progress Notes (Signed)
Physical Therapy Treatment Patient Details  Name: Scott Garner MRN: [id number] Date of Birth: Feb 19, 1946  Today's Date: 05/22/2012 Time: 0800-0844 PT Time Calculation (min): 44 min Visit#: 10  of 12   Re-eval: 05/18/12 Authorization: medicare- g code changed at visit 9   Authorization Visit#: 10  of 12   Charges:  Massage 12', manual 18', [place] 8'  Subjective: Symptoms/Limitations Symptoms: Pt. states he's still a little stiff and sore but thinks the [place] helped last visit. Pain Assessment Currently in Pain?: Yes Pain Score:   1 Pain Location: Neck Pain Orientation: Left   Exercise/Treatments Machines for Strengthening UBE (Upper Arm Bike): 4' at 2.0   Modalities Modalities: Ultrasound;Traction Manual Therapy Manual Therapy: Massage Massage: [place] f/b massage to L upper trap/cervical area.  manual traction and PROM for rotation following. Moist Heat Therapy Moist Heat Location:  (cerivcal) Ultrasound Ultrasound Location: L upper trap and cervical in seated position Ultrasound Parameters: 3 MHz, 1.2w/cm2 X 8 minutes Ultrasound Goals: Pain Traction Type of Traction: Other (comment) (manual)  Physical Therapy Assessment and Plan PT Assessment and Plan Clinical Impression Statement: Spasms/tightness in L upper trap area decreased 80% following [place] and STM.  90% ROM achieved with PROM for B rotation.  Pt. reported overall reduction of symptoms following treatment. Rehab Potential: Good PT Plan: Continue with manual techniques X last 2 visits per evaluating therapist to work on pain reduction.    Problem List Patient Active Problem List  Diagnosis  . Neck pain on left side  . Stiffness of joints, not elsewhere classified, multiple sites    PT - End of Session Activity Tolerance: Patient tolerated treatment well General Behavior During Session: [facility] for tasks performed Cognition: [facility] for tasks performed   Lurena Nida, PTA/CLT 05/22/2012, 9:45 AM

## 2012-05-23 ENCOUNTER — Ambulatory Visit (HOSPITAL_COMMUNITY): Payer: Medicare Other | Admitting: *Deleted

## 2012-05-23 DIAGNOSIS — E871 Hypo-osmolality and hyponatremia: Secondary | ICD-10-CM | POA: Diagnosis not present

## 2012-05-23 DIAGNOSIS — Z23 Encounter for immunization: Secondary | ICD-10-CM | POA: Diagnosis not present

## 2012-05-23 DIAGNOSIS — E7800 Pure hypercholesterolemia, unspecified: Secondary | ICD-10-CM | POA: Diagnosis not present

## 2012-05-23 DIAGNOSIS — I10 Essential (primary) hypertension: Secondary | ICD-10-CM | POA: Diagnosis not present

## 2012-05-23 DIAGNOSIS — E109 Type 1 diabetes mellitus without complications: Secondary | ICD-10-CM | POA: Diagnosis not present

## 2012-05-24 ENCOUNTER — Ambulatory Visit (HOSPITAL_COMMUNITY)
Admission: RE | Admit: 2012-05-24 | Discharge: 2012-05-24 | Disposition: A | Discharge status: Home / Self Care | Payer: Medicare Other | Source: Ambulatory Visit | Attending: Neurosurgery | Admitting: Neurosurgery

## 2012-05-24 NOTE — Progress Notes (Signed)
Physical Therapy Treatment Patient Details  Name: Scott Garner MRN: [id number] Date of Birth: 1945-08-23  Today's Date: 05/24/2012 Time: 0813-0910 PT Time Calculation (min): 57 min  Visit#: 11  of 12   Re-eval:  (one more session scheduled for 05/30/2012 )  Charge: [place] 8', Massage 18', MHP 10, manual 20'  Authorization: medicare- g code changed at visit 9  Authorization Time Period:    Authorization Visit#: 11  of 12    Subjective: Symptoms/Limitations Symptoms: "My pain is there, stated completed the chair push-ups and stretches and believes it is helping."  Pain scale 1 and 1/2 Pain Assessment Currently in Pain?: Yes Pain Score:   1 Pain Location: Neck Pain Orientation: Left;Posterior  Objective:   Exercise/Treatments Modalities Modalities: Ultrasound;Moist Heat Manual Therapy Manual Therapy: Massage Massage: [place] f/b massage to L upper trap/cervical area. manual traction and PROM for rotation following Moist Heat Therapy Number Minutes Moist Heat: 10 Minutes (10' following [place] prior manual) Moist Heat Location: Other (comment) (cervical) Ultrasound Ultrasound Location: L upper trap and cervical in seated position Ultrasound Parameters: 3 MHz, 1.2w/cm2 continuous X 8 minutes Ultrasound Goals: Pain Traction Type of Traction: Other (comment) (manual)  Physical Therapy Assessment and Plan PT Assessment and Plan Clinical Impression Statement: Spasms minimized L upper trap and totally resolved cervical following [place] and manual with improved active B cervical rotation achieved following manual PROM.  Pt reported overall reduction of symptoms at end of session. PT Plan: Continue with manual techniques 1 more visit for pain reduction.  F/U with MD apt scheduled for next Monday.    Goals    Problem List Patient Active Problem List  Diagnosis  . Neck pain on left side  . Stiffness of joints, not elsewhere classified, multiple sites    PT - End of Session Activity  Tolerance: Patient tolerated treatment well General Behavior During Session: [facility] for tasks performed Cognition: [facility] for tasks performed  GP    Juel Burrow 05/24/2012, 10:25 AM

## 2012-05-25 ENCOUNTER — Ambulatory Visit (HOSPITAL_COMMUNITY): Payer: Medicare Other | Admitting: *Deleted

## 2012-05-29 DIAGNOSIS — M47812 Spondylosis without myelopathy or radiculopathy, cervical region: Secondary | ICD-10-CM | POA: Diagnosis not present

## 2012-05-30 ENCOUNTER — Ambulatory Visit (HOSPITAL_COMMUNITY): Payer: Medicare Other | Admitting: *Deleted

## 2012-06-19 DIAGNOSIS — M999 Biomechanical lesion, unspecified: Secondary | ICD-10-CM | POA: Diagnosis not present

## 2012-06-19 DIAGNOSIS — M5030 Other cervical disc degeneration, unspecified cervical region: Secondary | ICD-10-CM | POA: Diagnosis not present

## 2012-06-21 DIAGNOSIS — M5030 Other cervical disc degeneration, unspecified cervical region: Secondary | ICD-10-CM | POA: Diagnosis not present

## 2012-06-21 DIAGNOSIS — M999 Biomechanical lesion, unspecified: Secondary | ICD-10-CM | POA: Diagnosis not present

## 2012-06-22 DIAGNOSIS — M5030 Other cervical disc degeneration, unspecified cervical region: Secondary | ICD-10-CM | POA: Diagnosis not present

## 2012-06-22 DIAGNOSIS — M999 Biomechanical lesion, unspecified: Secondary | ICD-10-CM | POA: Diagnosis not present

## 2012-06-26 DIAGNOSIS — M5030 Other cervical disc degeneration, unspecified cervical region: Secondary | ICD-10-CM | POA: Diagnosis not present

## 2012-06-26 DIAGNOSIS — M999 Biomechanical lesion, unspecified: Secondary | ICD-10-CM | POA: Diagnosis not present

## 2012-06-27 DIAGNOSIS — M999 Biomechanical lesion, unspecified: Secondary | ICD-10-CM | POA: Diagnosis not present

## 2012-06-27 DIAGNOSIS — M5030 Other cervical disc degeneration, unspecified cervical region: Secondary | ICD-10-CM | POA: Diagnosis not present

## 2012-08-17 DIAGNOSIS — E7800 Pure hypercholesterolemia, unspecified: Secondary | ICD-10-CM | POA: Diagnosis not present

## 2012-08-17 DIAGNOSIS — E109 Type 1 diabetes mellitus without complications: Secondary | ICD-10-CM | POA: Diagnosis not present

## 2012-08-17 DIAGNOSIS — Z125 Encounter for screening for malignant neoplasm of prostate: Secondary | ICD-10-CM | POA: Diagnosis not present

## 2012-08-19 DIAGNOSIS — Z6826 Body mass index (BMI) 26.0-26.9, adult: Secondary | ICD-10-CM | POA: Diagnosis not present

## 2012-08-19 DIAGNOSIS — H6120 Impacted cerumen, unspecified ear: Secondary | ICD-10-CM | POA: Diagnosis not present

## 2012-08-19 DIAGNOSIS — H60399 Other infective otitis externa, unspecified ear: Secondary | ICD-10-CM | POA: Diagnosis not present

## 2012-08-23 DIAGNOSIS — I10 Essential (primary) hypertension: Secondary | ICD-10-CM | POA: Diagnosis not present

## 2012-08-23 DIAGNOSIS — E109 Type 1 diabetes mellitus without complications: Secondary | ICD-10-CM | POA: Diagnosis not present

## 2012-08-23 DIAGNOSIS — E7800 Pure hypercholesterolemia, unspecified: Secondary | ICD-10-CM | POA: Diagnosis not present

## 2012-10-05 DIAGNOSIS — Z1211 Encounter for screening for malignant neoplasm of colon: Secondary | ICD-10-CM | POA: Diagnosis not present

## 2012-10-05 NOTE — H&P (Signed)
  NTS SOAP Note  Vital Signs:  Vitals as of: 10/05/2012: Systolic 168: Diastolic 73: Heart Rate 64: Temp 98.42F: Height 75ft 9in: Weight 171Lbs 0 Ounces: BMI 25  BMI : 25.25 kg/m2  Subjective: This 56 Years 33 Months old Male presents for screening TCS.  Denies any gi complaints.  Never has had a colonoscopy.  No family h/o colon cancer.   Review of Symptoms:  Constitutional:unremarkable   Head:unremarkable    Eyes:unremarkable   Nose/Mouth/Throat:unremarkable Cardiovascular:  unremarkable   Respiratory:unremarkable   Gastrointestinal:  unremarkable   Genitourinary:    frequency   neck pain Skin:unremarkable Hematolgic/Lymphatic:unremarkable     Allergic/Immunologic:unremarkable     Past Medical History:    Reviewed   Past Medical History  Surgical History: none Medical Problems:  Diabetes, High Blood pressure, High cholesterol Allergies: nkda Medications: amlodipine, pravastatin, ramipril, novolog/insulin pump, paradigm, baby asa, fish oil   Social History:Reviewed  Social History  Preferred Language: English Race:  White Ethnicity: Not Hispanic / Latino Age: 67 Years 4 Months Marital Status:  M Alcohol: rarely Recreational drug(s):  No   Smoking Status: Never smoker reviewed on 10/05/2012 Functional Status reviewed on mm/dd/yyyy ------------------------------------------------ Bathing: Normal Cooking: Normal Dressing: Normal Driving: Normal Eating: Normal Managing Meds: Normal Oral Care: Normal Shopping: Normal Toileting: Normal Transferring: Normal Walking: Normal Cognitive Status reviewed on mm/dd/yyyy ------------------------------------------------ Attention: Normal Decision Making: Normal Language: Normal Memory: Normal Motor: Normal Perception: Normal Problem Solving: Normal Visual and Spatial: Normal   Family History:  Reviewed   Family History  Is there a family history FA:OZHY,  No family  h/o colon cancer    Objective Information: General:  Well appearing, well nourished in no distress. Heart:  RRR, no murmur Lungs:    CTA bilaterally, no wheezes, rhonchi, rales.  Breathing unlabored. Abdomen:Soft, NT/ND, no HSM, no masses.   deferred to procedure  Assessment:Need for screening TCS  Diagnosis &amp; Procedure:    Plan:Scheduled for screening TCS on 10/24/12.   Patient Education:Alternative treatments to surgery were discussed with patient (and family).  Risks and benefits  of procedure were fully explained to the patient (and family) who gave informed consent. Patient/family questions were addressed.  Follow-up:Pending Surgery

## 2012-10-17 ENCOUNTER — Encounter (HOSPITAL_COMMUNITY): Payer: Self-pay | Admitting: Pharmacy Technician

## 2012-10-24 ENCOUNTER — Encounter (HOSPITAL_COMMUNITY)
Admission: RE | Disposition: A | Discharge status: Home / Self Care | Payer: Self-pay | Source: Ambulatory Visit | Attending: General Surgery

## 2012-10-24 ENCOUNTER — Ambulatory Visit (HOSPITAL_COMMUNITY)
Admission: RE | Admit: 2012-10-24 | Discharge: 2012-10-24 | Disposition: A | Discharge status: Home / Self Care | Payer: Medicare Other | Source: Ambulatory Visit | Attending: General Surgery | Admitting: General Surgery

## 2012-10-24 ENCOUNTER — Encounter (HOSPITAL_COMMUNITY): Payer: Self-pay | Admitting: *Deleted

## 2012-10-24 DIAGNOSIS — I10 Essential (primary) hypertension: Secondary | ICD-10-CM | POA: Diagnosis not present

## 2012-10-24 DIAGNOSIS — Z1211 Encounter for screening for malignant neoplasm of colon: Secondary | ICD-10-CM | POA: Insufficient documentation

## 2012-10-24 DIAGNOSIS — Z01812 Encounter for preprocedural laboratory examination: Secondary | ICD-10-CM | POA: Diagnosis not present

## 2012-10-24 DIAGNOSIS — E119 Type 2 diabetes mellitus without complications: Secondary | ICD-10-CM | POA: Diagnosis not present

## 2012-10-24 HISTORY — DX: Pure hypercholesterolemia, unspecified: E78.00

## 2012-10-24 HISTORY — DX: Type 2 diabetes mellitus without complications: E11.9

## 2012-10-24 HISTORY — DX: Essential (primary) hypertension: I10

## 2012-10-24 HISTORY — PX: COLONOSCOPY: SHX5424

## 2012-10-24 SURGERY — COLONOSCOPY
Anesthesia: Moderate Sedation

## 2012-10-24 MED ORDER — MEPERIDINE HCL 25 MG/ML IJ SOLN
INTRAMUSCULAR | Status: DC | PRN
  Administered 2012-10-24: 50 mg via INTRAVENOUS

## 2012-10-24 MED ORDER — MEPERIDINE HCL 100 MG/ML IJ SOLN
INTRAMUSCULAR | Status: AC
  Filled 2012-10-24: qty 1

## 2012-10-24 MED ORDER — MIDAZOLAM HCL 5 MG/5ML IJ SOLN
INTRAMUSCULAR | Status: DC | PRN
  Administered 2012-10-24: 4 mg via INTRAVENOUS

## 2012-10-24 MED ORDER — SODIUM CHLORIDE 0.9 % IV SOLN
INTRAVENOUS | Status: DC
  Administered 2012-10-24: 08:00:00 via INTRAVENOUS

## 2012-10-24 MED ORDER — MIDAZOLAM HCL 5 MG/5ML IJ SOLN
INTRAMUSCULAR | Status: AC
  Filled 2012-10-24: qty 10

## 2012-10-24 MED ORDER — ATROPINE SULFATE 1 MG/ML IJ SOLN
INTRAMUSCULAR | Status: DC | PRN
  Administered 2012-10-24: .5 mg via INTRAVENOUS

## 2012-10-24 NOTE — Op Note (Signed)
[facility] [address] [place], [address]   COLONOSCOPY PROCEDURE REPORT  PATIENT: Scott Garner, Scott Garner  MR#: [id number] BIRTHDATE: 03-19-1946 , 66  yrs. old GENDER: Male ENDOSCOPIST: Franky Macho, MD REFERRED AO:[id number], John PROCEDURE DATE:  10/24/2012 PROCEDURE:   Colonoscopy, screening ASA CLASS:   Class II INDICATIONS:Average risk patient for colon cancer. MEDICATIONS: Versed 4 mg IV, Demerol 50 mg IV, and Atropine .5 mg IV   DESCRIPTION OF PROCEDURE:   After the risks benefits and alternatives of the procedure were thoroughly explained, informed consent was obtained.  A digital rectal exam revealed no abnormalities of the rectum.   The EC-[id number] ([id number])  endoscope was introduced through the anus and advanced to the cecum, which was identified by both the appendix and ileocecal valve. No adverse events experienced.   The quality of the prep was adequate, using MoviPrep  The instrument was then slowly withdrawn as the colon was fully examined.      COLON FINDINGS: A normal appearing cecum, ileocecal valve, and appendiceal orifice were identified.  The ascending, hepatic flexure, transverse, splenic flexure, descending, sigmoid colon and rectum appeared unremarkable.  No polyps or cancers were seen. Retroflexed views revealed no abnormalities. The time to cecum=minutes 5 seconds.  Withdrawal time=4 minutes 0 seconds.  The scope was withdrawn and the procedure completed. COMPLICATIONS: There were no complications.  ENDOSCOPIC IMPRESSION: Normal colon  RECOMMENDATIONS: Repeat Colonscopy in 10 years.   eSigned:  Franky Macho, MD 10/24/2012 8:46 AM   cc:

## 2012-10-24 NOTE — Interval H&P Note (Signed)
History and Physical Interval Note:  10/24/2012 8:27 AM  Scott Garner  has presented today for surgery, with the diagnosis of screening  The various methods of treatment have been discussed with the patient and family. After consideration of risks, benefits and other options for treatment, the patient has consented to  Procedure(s): COLONOSCOPY (N/A) as a surgical intervention .  The patient's history has been reviewed, patient examined, no change in status, stable for surgery.  I have reviewed the patient's chart and labs.  Questions were answered to the patient's satisfaction.     Franky Macho A

## 2012-10-27 ENCOUNTER — Encounter (HOSPITAL_COMMUNITY): Payer: Self-pay | Admitting: General Surgery

## 2012-11-06 DIAGNOSIS — I10 Essential (primary) hypertension: Secondary | ICD-10-CM | POA: Diagnosis not present

## 2012-11-06 DIAGNOSIS — E109 Type 1 diabetes mellitus without complications: Secondary | ICD-10-CM | POA: Diagnosis not present

## 2012-11-06 DIAGNOSIS — E7800 Pure hypercholesterolemia, unspecified: Secondary | ICD-10-CM | POA: Diagnosis not present

## 2013-01-19 DIAGNOSIS — L259 Unspecified contact dermatitis, unspecified cause: Secondary | ICD-10-CM | POA: Diagnosis not present

## 2013-01-19 DIAGNOSIS — Z6826 Body mass index (BMI) 26.0-26.9, adult: Secondary | ICD-10-CM | POA: Diagnosis not present

## 2013-01-19 DIAGNOSIS — I10 Essential (primary) hypertension: Secondary | ICD-10-CM | POA: Diagnosis not present

## 2013-01-19 DIAGNOSIS — E119 Type 2 diabetes mellitus without complications: Secondary | ICD-10-CM | POA: Diagnosis not present

## 2013-02-01 DIAGNOSIS — E109 Type 1 diabetes mellitus without complications: Secondary | ICD-10-CM | POA: Diagnosis not present

## 2013-02-01 DIAGNOSIS — E7800 Pure hypercholesterolemia, unspecified: Secondary | ICD-10-CM | POA: Diagnosis not present

## 2013-02-06 DIAGNOSIS — I10 Essential (primary) hypertension: Secondary | ICD-10-CM | POA: Diagnosis not present

## 2013-02-06 DIAGNOSIS — E7800 Pure hypercholesterolemia, unspecified: Secondary | ICD-10-CM | POA: Diagnosis not present

## 2013-02-06 DIAGNOSIS — E109 Type 1 diabetes mellitus without complications: Secondary | ICD-10-CM | POA: Diagnosis not present

## 2013-02-21 DIAGNOSIS — E119 Type 2 diabetes mellitus without complications: Secondary | ICD-10-CM | POA: Diagnosis not present

## 2013-05-14 DIAGNOSIS — E7800 Pure hypercholesterolemia, unspecified: Secondary | ICD-10-CM | POA: Diagnosis not present

## 2013-05-14 DIAGNOSIS — I10 Essential (primary) hypertension: Secondary | ICD-10-CM | POA: Diagnosis not present

## 2013-05-14 DIAGNOSIS — E109 Type 1 diabetes mellitus without complications: Secondary | ICD-10-CM | POA: Diagnosis not present

## 2013-05-14 DIAGNOSIS — Z23 Encounter for immunization: Secondary | ICD-10-CM | POA: Diagnosis not present

## 2013-05-16 DIAGNOSIS — Z6825 Body mass index (BMI) 25.0-25.9, adult: Secondary | ICD-10-CM | POA: Diagnosis not present

## 2013-05-16 DIAGNOSIS — H60399 Other infective otitis externa, unspecified ear: Secondary | ICD-10-CM | POA: Diagnosis not present

## 2013-08-14 DIAGNOSIS — E7800 Pure hypercholesterolemia, unspecified: Secondary | ICD-10-CM | POA: Diagnosis not present

## 2013-08-14 DIAGNOSIS — E109 Type 1 diabetes mellitus without complications: Secondary | ICD-10-CM | POA: Diagnosis not present

## 2013-08-20 DIAGNOSIS — E871 Hypo-osmolality and hyponatremia: Secondary | ICD-10-CM | POA: Diagnosis not present

## 2013-08-20 DIAGNOSIS — E7800 Pure hypercholesterolemia, unspecified: Secondary | ICD-10-CM | POA: Diagnosis not present

## 2013-08-20 DIAGNOSIS — I10 Essential (primary) hypertension: Secondary | ICD-10-CM | POA: Diagnosis not present

## 2013-08-20 DIAGNOSIS — E109 Type 1 diabetes mellitus without complications: Secondary | ICD-10-CM | POA: Diagnosis not present

## 2013-10-01 DIAGNOSIS — E871 Hypo-osmolality and hyponatremia: Secondary | ICD-10-CM | POA: Diagnosis not present

## 2013-10-19 DIAGNOSIS — Z6826 Body mass index (BMI) 26.0-26.9, adult: Secondary | ICD-10-CM | POA: Diagnosis not present

## 2013-10-19 DIAGNOSIS — M5430 Sciatica, unspecified side: Secondary | ICD-10-CM | POA: Diagnosis not present

## 2013-12-04 DIAGNOSIS — E7800 Pure hypercholesterolemia, unspecified: Secondary | ICD-10-CM | POA: Diagnosis not present

## 2013-12-04 DIAGNOSIS — I10 Essential (primary) hypertension: Secondary | ICD-10-CM | POA: Diagnosis not present

## 2013-12-04 DIAGNOSIS — E109 Type 1 diabetes mellitus without complications: Secondary | ICD-10-CM | POA: Diagnosis not present

## 2014-02-20 DIAGNOSIS — E119 Type 2 diabetes mellitus without complications: Secondary | ICD-10-CM | POA: Diagnosis not present

## 2014-02-20 DIAGNOSIS — H2510 Age-related nuclear cataract, unspecified eye: Secondary | ICD-10-CM | POA: Diagnosis not present

## 2014-03-06 DIAGNOSIS — E109 Type 1 diabetes mellitus without complications: Secondary | ICD-10-CM | POA: Diagnosis not present

## 2014-03-06 DIAGNOSIS — E7800 Pure hypercholesterolemia, unspecified: Secondary | ICD-10-CM | POA: Diagnosis not present

## 2014-03-11 DIAGNOSIS — E109 Type 1 diabetes mellitus without complications: Secondary | ICD-10-CM | POA: Diagnosis not present

## 2014-03-11 DIAGNOSIS — E7800 Pure hypercholesterolemia, unspecified: Secondary | ICD-10-CM | POA: Diagnosis not present

## 2014-03-11 DIAGNOSIS — I10 Essential (primary) hypertension: Secondary | ICD-10-CM | POA: Diagnosis not present

## 2014-03-28 DIAGNOSIS — Z5189 Encounter for other specified aftercare: Secondary | ICD-10-CM | POA: Diagnosis not present

## 2014-03-28 DIAGNOSIS — Z6825 Body mass index (BMI) 25.0-25.9, adult: Secondary | ICD-10-CM | POA: Diagnosis not present

## 2014-03-28 DIAGNOSIS — Z23 Encounter for immunization: Secondary | ICD-10-CM | POA: Diagnosis not present

## 2014-04-24 DIAGNOSIS — N410 Acute prostatitis: Secondary | ICD-10-CM | POA: Diagnosis not present

## 2014-04-24 DIAGNOSIS — Z6825 Body mass index (BMI) 25.0-25.9, adult: Secondary | ICD-10-CM | POA: Diagnosis not present

## 2014-04-24 DIAGNOSIS — R55 Syncope and collapse: Secondary | ICD-10-CM | POA: Diagnosis not present

## 2014-06-06 DIAGNOSIS — E109 Type 1 diabetes mellitus without complications: Secondary | ICD-10-CM | POA: Diagnosis not present

## 2014-06-06 DIAGNOSIS — I10 Essential (primary) hypertension: Secondary | ICD-10-CM | POA: Diagnosis not present

## 2014-06-06 DIAGNOSIS — E780 Pure hypercholesterolemia: Secondary | ICD-10-CM | POA: Diagnosis not present

## 2014-06-06 DIAGNOSIS — E108 Type 1 diabetes mellitus with unspecified complications: Secondary | ICD-10-CM | POA: Diagnosis not present

## 2014-06-06 DIAGNOSIS — Z23 Encounter for immunization: Secondary | ICD-10-CM | POA: Diagnosis not present

## 2014-09-12 DIAGNOSIS — I10 Essential (primary) hypertension: Secondary | ICD-10-CM | POA: Diagnosis not present

## 2014-09-12 DIAGNOSIS — E780 Pure hypercholesterolemia: Secondary | ICD-10-CM | POA: Diagnosis not present

## 2014-09-12 DIAGNOSIS — E109 Type 1 diabetes mellitus without complications: Secondary | ICD-10-CM | POA: Diagnosis not present

## 2014-10-10 DIAGNOSIS — Z6841 Body Mass Index (BMI) 40.0 and over, adult: Secondary | ICD-10-CM | POA: Diagnosis not present

## 2014-10-10 DIAGNOSIS — M2669 Other specified disorders of temporomandibular joint: Secondary | ICD-10-CM | POA: Diagnosis not present

## 2014-12-05 DIAGNOSIS — E780 Pure hypercholesterolemia: Secondary | ICD-10-CM | POA: Diagnosis not present

## 2014-12-05 DIAGNOSIS — E109 Type 1 diabetes mellitus without complications: Secondary | ICD-10-CM | POA: Diagnosis not present

## 2014-12-11 DIAGNOSIS — E109 Type 1 diabetes mellitus without complications: Secondary | ICD-10-CM | POA: Diagnosis not present

## 2014-12-11 DIAGNOSIS — E780 Pure hypercholesterolemia: Secondary | ICD-10-CM | POA: Diagnosis not present

## 2014-12-11 DIAGNOSIS — I10 Essential (primary) hypertension: Secondary | ICD-10-CM | POA: Diagnosis not present

## 2014-12-11 DIAGNOSIS — E871 Hypo-osmolality and hyponatremia: Secondary | ICD-10-CM | POA: Diagnosis not present

## 2015-01-01 DIAGNOSIS — E871 Hypo-osmolality and hyponatremia: Secondary | ICD-10-CM | POA: Diagnosis not present

## 2015-02-26 DIAGNOSIS — H6123 Impacted cerumen, bilateral: Secondary | ICD-10-CM | POA: Diagnosis not present

## 2015-02-26 DIAGNOSIS — Z1389 Encounter for screening for other disorder: Secondary | ICD-10-CM | POA: Diagnosis not present

## 2015-02-26 DIAGNOSIS — Z6824 Body mass index (BMI) 24.0-24.9, adult: Secondary | ICD-10-CM | POA: Diagnosis not present

## 2015-03-10 DIAGNOSIS — E780 Pure hypercholesterolemia: Secondary | ICD-10-CM | POA: Diagnosis not present

## 2015-03-10 DIAGNOSIS — E109 Type 1 diabetes mellitus without complications: Secondary | ICD-10-CM | POA: Diagnosis not present

## 2015-03-13 DIAGNOSIS — E109 Type 1 diabetes mellitus without complications: Secondary | ICD-10-CM | POA: Diagnosis not present

## 2015-03-13 DIAGNOSIS — I10 Essential (primary) hypertension: Secondary | ICD-10-CM | POA: Diagnosis not present

## 2015-03-13 DIAGNOSIS — E871 Hypo-osmolality and hyponatremia: Secondary | ICD-10-CM | POA: Diagnosis not present

## 2015-03-13 DIAGNOSIS — E780 Pure hypercholesterolemia: Secondary | ICD-10-CM | POA: Diagnosis not present

## 2015-03-27 DIAGNOSIS — E119 Type 2 diabetes mellitus without complications: Secondary | ICD-10-CM | POA: Diagnosis not present

## 2015-03-27 DIAGNOSIS — H2513 Age-related nuclear cataract, bilateral: Secondary | ICD-10-CM | POA: Diagnosis not present

## 2015-03-27 DIAGNOSIS — H40003 Preglaucoma, unspecified, bilateral: Secondary | ICD-10-CM | POA: Diagnosis not present

## 2015-05-27 DIAGNOSIS — E119 Type 2 diabetes mellitus without complications: Secondary | ICD-10-CM | POA: Diagnosis not present

## 2015-05-27 DIAGNOSIS — H40003 Preglaucoma, unspecified, bilateral: Secondary | ICD-10-CM | POA: Diagnosis not present

## 2015-06-10 DIAGNOSIS — E7800 Pure hypercholesterolemia, unspecified: Secondary | ICD-10-CM | POA: Diagnosis not present

## 2015-06-10 DIAGNOSIS — E109 Type 1 diabetes mellitus without complications: Secondary | ICD-10-CM | POA: Diagnosis not present

## 2015-06-17 DIAGNOSIS — E109 Type 1 diabetes mellitus without complications: Secondary | ICD-10-CM | POA: Diagnosis not present

## 2015-06-17 DIAGNOSIS — Z23 Encounter for immunization: Secondary | ICD-10-CM | POA: Diagnosis not present

## 2015-06-17 DIAGNOSIS — I10 Essential (primary) hypertension: Secondary | ICD-10-CM | POA: Diagnosis not present

## 2015-06-17 DIAGNOSIS — E7800 Pure hypercholesterolemia, unspecified: Secondary | ICD-10-CM | POA: Diagnosis not present

## 2015-08-26 DIAGNOSIS — H40003 Preglaucoma, unspecified, bilateral: Secondary | ICD-10-CM | POA: Diagnosis not present

## 2015-09-01 DIAGNOSIS — H40003 Preglaucoma, unspecified, bilateral: Secondary | ICD-10-CM | POA: Diagnosis not present

## 2015-09-08 DIAGNOSIS — H40003 Preglaucoma, unspecified, bilateral: Secondary | ICD-10-CM | POA: Diagnosis not present

## 2015-09-16 DIAGNOSIS — Z9641 Presence of insulin pump (external) (internal): Secondary | ICD-10-CM | POA: Diagnosis not present

## 2015-09-16 DIAGNOSIS — E109 Type 1 diabetes mellitus without complications: Secondary | ICD-10-CM | POA: Diagnosis not present

## 2015-09-16 DIAGNOSIS — E7800 Pure hypercholesterolemia, unspecified: Secondary | ICD-10-CM | POA: Diagnosis not present

## 2015-09-16 DIAGNOSIS — I10 Essential (primary) hypertension: Secondary | ICD-10-CM | POA: Diagnosis not present

## 2015-09-29 DIAGNOSIS — E109 Type 1 diabetes mellitus without complications: Secondary | ICD-10-CM | POA: Diagnosis not present

## 2015-10-31 DIAGNOSIS — Z1389 Encounter for screening for other disorder: Secondary | ICD-10-CM | POA: Diagnosis not present

## 2015-10-31 DIAGNOSIS — Z0000 Encounter for general adult medical examination without abnormal findings: Secondary | ICD-10-CM | POA: Diagnosis not present

## 2015-10-31 DIAGNOSIS — Z6826 Body mass index (BMI) 26.0-26.9, adult: Secondary | ICD-10-CM | POA: Diagnosis not present

## 2015-10-31 DIAGNOSIS — E663 Overweight: Secondary | ICD-10-CM | POA: Diagnosis not present

## 2015-12-12 DIAGNOSIS — E109 Type 1 diabetes mellitus without complications: Secondary | ICD-10-CM | POA: Diagnosis not present

## 2015-12-12 DIAGNOSIS — E7800 Pure hypercholesterolemia, unspecified: Secondary | ICD-10-CM | POA: Diagnosis not present

## 2015-12-15 DIAGNOSIS — Z9641 Presence of insulin pump (external) (internal): Secondary | ICD-10-CM | POA: Diagnosis not present

## 2015-12-15 DIAGNOSIS — E7800 Pure hypercholesterolemia, unspecified: Secondary | ICD-10-CM | POA: Diagnosis not present

## 2015-12-15 DIAGNOSIS — I10 Essential (primary) hypertension: Secondary | ICD-10-CM | POA: Diagnosis not present

## 2015-12-15 DIAGNOSIS — E109 Type 1 diabetes mellitus without complications: Secondary | ICD-10-CM | POA: Diagnosis not present

## 2016-04-01 DIAGNOSIS — E7800 Pure hypercholesterolemia, unspecified: Secondary | ICD-10-CM | POA: Diagnosis not present

## 2016-04-01 DIAGNOSIS — E109 Type 1 diabetes mellitus without complications: Secondary | ICD-10-CM | POA: Diagnosis not present

## 2016-04-01 DIAGNOSIS — Z9641 Presence of insulin pump (external) (internal): Secondary | ICD-10-CM | POA: Diagnosis not present

## 2016-04-01 DIAGNOSIS — I10 Essential (primary) hypertension: Secondary | ICD-10-CM | POA: Diagnosis not present

## 2016-05-06 DIAGNOSIS — E109 Type 1 diabetes mellitus without complications: Secondary | ICD-10-CM | POA: Diagnosis not present

## 2016-05-06 DIAGNOSIS — H2513 Age-related nuclear cataract, bilateral: Secondary | ICD-10-CM | POA: Diagnosis not present

## 2016-05-06 DIAGNOSIS — H538 Other visual disturbances: Secondary | ICD-10-CM | POA: Diagnosis not present

## 2016-05-06 DIAGNOSIS — E119 Type 2 diabetes mellitus without complications: Secondary | ICD-10-CM | POA: Diagnosis not present

## 2016-06-09 DIAGNOSIS — E109 Type 1 diabetes mellitus without complications: Secondary | ICD-10-CM | POA: Diagnosis not present

## 2016-06-09 DIAGNOSIS — E7800 Pure hypercholesterolemia, unspecified: Secondary | ICD-10-CM | POA: Diagnosis not present

## 2016-06-09 DIAGNOSIS — I10 Essential (primary) hypertension: Secondary | ICD-10-CM | POA: Diagnosis not present

## 2016-06-16 DIAGNOSIS — Z9641 Presence of insulin pump (external) (internal): Secondary | ICD-10-CM | POA: Diagnosis not present

## 2016-06-16 DIAGNOSIS — E109 Type 1 diabetes mellitus without complications: Secondary | ICD-10-CM | POA: Diagnosis not present

## 2016-06-16 DIAGNOSIS — I10 Essential (primary) hypertension: Secondary | ICD-10-CM | POA: Diagnosis not present

## 2016-06-16 DIAGNOSIS — E7800 Pure hypercholesterolemia, unspecified: Secondary | ICD-10-CM | POA: Diagnosis not present

## 2016-06-16 DIAGNOSIS — Z23 Encounter for immunization: Secondary | ICD-10-CM | POA: Diagnosis not present

## 2016-07-10 DIAGNOSIS — J018 Other acute sinusitis: Secondary | ICD-10-CM | POA: Diagnosis not present

## 2016-09-07 DIAGNOSIS — H40013 Open angle with borderline findings, low risk, bilateral: Secondary | ICD-10-CM | POA: Diagnosis not present

## 2016-09-07 DIAGNOSIS — E109 Type 1 diabetes mellitus without complications: Secondary | ICD-10-CM | POA: Diagnosis not present

## 2016-09-07 DIAGNOSIS — E119 Type 2 diabetes mellitus without complications: Secondary | ICD-10-CM | POA: Diagnosis not present

## 2016-09-07 DIAGNOSIS — H2513 Age-related nuclear cataract, bilateral: Secondary | ICD-10-CM | POA: Diagnosis not present

## 2016-09-17 DIAGNOSIS — Z9641 Presence of insulin pump (external) (internal): Secondary | ICD-10-CM | POA: Diagnosis not present

## 2016-09-17 DIAGNOSIS — E7800 Pure hypercholesterolemia, unspecified: Secondary | ICD-10-CM | POA: Diagnosis not present

## 2016-09-17 DIAGNOSIS — I10 Essential (primary) hypertension: Secondary | ICD-10-CM | POA: Diagnosis not present

## 2016-09-17 DIAGNOSIS — E109 Type 1 diabetes mellitus without complications: Secondary | ICD-10-CM | POA: Diagnosis not present

## 2016-09-30 DIAGNOSIS — Z1389 Encounter for screening for other disorder: Secondary | ICD-10-CM | POA: Diagnosis not present

## 2016-09-30 DIAGNOSIS — Z6826 Body mass index (BMI) 26.0-26.9, adult: Secondary | ICD-10-CM | POA: Diagnosis not present

## 2016-09-30 DIAGNOSIS — H6123 Impacted cerumen, bilateral: Secondary | ICD-10-CM | POA: Diagnosis not present

## 2016-12-15 DIAGNOSIS — E109 Type 1 diabetes mellitus without complications: Secondary | ICD-10-CM | POA: Diagnosis not present

## 2016-12-15 DIAGNOSIS — Z9641 Presence of insulin pump (external) (internal): Secondary | ICD-10-CM | POA: Diagnosis not present

## 2016-12-15 DIAGNOSIS — E7800 Pure hypercholesterolemia, unspecified: Secondary | ICD-10-CM | POA: Diagnosis not present

## 2016-12-15 DIAGNOSIS — I10 Essential (primary) hypertension: Secondary | ICD-10-CM | POA: Diagnosis not present

## 2017-02-14 DIAGNOSIS — Z1389 Encounter for screening for other disorder: Secondary | ICD-10-CM | POA: Diagnosis not present

## 2017-02-14 DIAGNOSIS — Z6827 Body mass index (BMI) 27.0-27.9, adult: Secondary | ICD-10-CM | POA: Diagnosis not present

## 2017-02-14 DIAGNOSIS — E663 Overweight: Secondary | ICD-10-CM | POA: Diagnosis not present

## 2017-02-14 DIAGNOSIS — Z0000 Encounter for general adult medical examination without abnormal findings: Secondary | ICD-10-CM | POA: Diagnosis not present

## 2017-03-03 DIAGNOSIS — E782 Mixed hyperlipidemia: Secondary | ICD-10-CM | POA: Diagnosis not present

## 2017-03-03 DIAGNOSIS — E663 Overweight: Secondary | ICD-10-CM | POA: Diagnosis not present

## 2017-03-03 DIAGNOSIS — E109 Type 1 diabetes mellitus without complications: Secondary | ICD-10-CM | POA: Diagnosis not present

## 2017-03-03 DIAGNOSIS — Z6828 Body mass index (BMI) 28.0-28.9, adult: Secondary | ICD-10-CM | POA: Diagnosis not present

## 2017-03-03 DIAGNOSIS — I10 Essential (primary) hypertension: Secondary | ICD-10-CM | POA: Diagnosis not present

## 2017-03-07 DIAGNOSIS — H2513 Age-related nuclear cataract, bilateral: Secondary | ICD-10-CM | POA: Diagnosis not present

## 2017-03-07 DIAGNOSIS — H40013 Open angle with borderline findings, low risk, bilateral: Secondary | ICD-10-CM | POA: Diagnosis not present

## 2017-03-07 DIAGNOSIS — E119 Type 2 diabetes mellitus without complications: Secondary | ICD-10-CM | POA: Diagnosis not present

## 2017-03-16 DIAGNOSIS — E109 Type 1 diabetes mellitus without complications: Secondary | ICD-10-CM | POA: Diagnosis not present

## 2017-03-16 DIAGNOSIS — E7800 Pure hypercholesterolemia, unspecified: Secondary | ICD-10-CM | POA: Diagnosis not present

## 2017-03-21 DIAGNOSIS — Z9641 Presence of insulin pump (external) (internal): Secondary | ICD-10-CM | POA: Diagnosis not present

## 2017-03-21 DIAGNOSIS — E7800 Pure hypercholesterolemia, unspecified: Secondary | ICD-10-CM | POA: Diagnosis not present

## 2017-03-21 DIAGNOSIS — E109 Type 1 diabetes mellitus without complications: Secondary | ICD-10-CM | POA: Diagnosis not present

## 2017-03-21 DIAGNOSIS — I10 Essential (primary) hypertension: Secondary | ICD-10-CM | POA: Diagnosis not present

## 2017-03-22 DIAGNOSIS — E119 Type 2 diabetes mellitus without complications: Secondary | ICD-10-CM | POA: Diagnosis not present

## 2017-03-22 DIAGNOSIS — B351 Tinea unguium: Secondary | ICD-10-CM | POA: Diagnosis not present

## 2017-04-18 DIAGNOSIS — H2513 Age-related nuclear cataract, bilateral: Secondary | ICD-10-CM | POA: Diagnosis not present

## 2017-04-18 DIAGNOSIS — H40013 Open angle with borderline findings, low risk, bilateral: Secondary | ICD-10-CM | POA: Diagnosis not present

## 2017-06-13 DIAGNOSIS — M79675 Pain in left toe(s): Secondary | ICD-10-CM | POA: Diagnosis not present

## 2017-06-13 DIAGNOSIS — E119 Type 2 diabetes mellitus without complications: Secondary | ICD-10-CM | POA: Diagnosis not present

## 2017-06-13 DIAGNOSIS — B351 Tinea unguium: Secondary | ICD-10-CM | POA: Diagnosis not present

## 2017-06-13 DIAGNOSIS — M79674 Pain in right toe(s): Secondary | ICD-10-CM | POA: Diagnosis not present

## 2017-06-17 DIAGNOSIS — I10 Essential (primary) hypertension: Secondary | ICD-10-CM | POA: Diagnosis not present

## 2017-06-17 DIAGNOSIS — Z23 Encounter for immunization: Secondary | ICD-10-CM | POA: Diagnosis not present

## 2017-06-17 DIAGNOSIS — E7800 Pure hypercholesterolemia, unspecified: Secondary | ICD-10-CM | POA: Diagnosis not present

## 2017-06-17 DIAGNOSIS — Z9641 Presence of insulin pump (external) (internal): Secondary | ICD-10-CM | POA: Diagnosis not present

## 2017-06-17 DIAGNOSIS — E109 Type 1 diabetes mellitus without complications: Secondary | ICD-10-CM | POA: Diagnosis not present

## 2017-08-23 DIAGNOSIS — E1142 Type 2 diabetes mellitus with diabetic polyneuropathy: Secondary | ICD-10-CM | POA: Diagnosis not present

## 2017-08-23 DIAGNOSIS — B351 Tinea unguium: Secondary | ICD-10-CM | POA: Diagnosis not present

## 2017-08-23 DIAGNOSIS — L851 Acquired keratosis [keratoderma] palmaris et plantaris: Secondary | ICD-10-CM | POA: Diagnosis not present

## 2017-09-15 DIAGNOSIS — I10 Essential (primary) hypertension: Secondary | ICD-10-CM | POA: Diagnosis not present

## 2017-09-15 DIAGNOSIS — E109 Type 1 diabetes mellitus without complications: Secondary | ICD-10-CM | POA: Diagnosis not present

## 2017-09-15 DIAGNOSIS — Z9641 Presence of insulin pump (external) (internal): Secondary | ICD-10-CM | POA: Diagnosis not present

## 2017-09-15 DIAGNOSIS — L602 Onychogryphosis: Secondary | ICD-10-CM | POA: Diagnosis not present

## 2017-09-15 DIAGNOSIS — E7800 Pure hypercholesterolemia, unspecified: Secondary | ICD-10-CM | POA: Diagnosis not present

## 2017-10-17 DIAGNOSIS — H2513 Age-related nuclear cataract, bilateral: Secondary | ICD-10-CM | POA: Diagnosis not present

## 2017-10-17 DIAGNOSIS — E109 Type 1 diabetes mellitus without complications: Secondary | ICD-10-CM | POA: Diagnosis not present

## 2017-10-17 DIAGNOSIS — H40013 Open angle with borderline findings, low risk, bilateral: Secondary | ICD-10-CM | POA: Diagnosis not present

## 2017-10-17 DIAGNOSIS — E119 Type 2 diabetes mellitus without complications: Secondary | ICD-10-CM | POA: Diagnosis not present

## 2017-11-11 DIAGNOSIS — B351 Tinea unguium: Secondary | ICD-10-CM | POA: Diagnosis not present

## 2017-11-11 DIAGNOSIS — E1142 Type 2 diabetes mellitus with diabetic polyneuropathy: Secondary | ICD-10-CM | POA: Diagnosis not present

## 2017-11-11 DIAGNOSIS — L851 Acquired keratosis [keratoderma] palmaris et plantaris: Secondary | ICD-10-CM | POA: Diagnosis not present

## 2017-12-09 DIAGNOSIS — E7800 Pure hypercholesterolemia, unspecified: Secondary | ICD-10-CM | POA: Diagnosis not present

## 2017-12-09 DIAGNOSIS — E109 Type 1 diabetes mellitus without complications: Secondary | ICD-10-CM | POA: Diagnosis not present

## 2017-12-14 DIAGNOSIS — Z9641 Presence of insulin pump (external) (internal): Secondary | ICD-10-CM | POA: Diagnosis not present

## 2017-12-14 DIAGNOSIS — M2550 Pain in unspecified joint: Secondary | ICD-10-CM | POA: Diagnosis not present

## 2017-12-14 DIAGNOSIS — E109 Type 1 diabetes mellitus without complications: Secondary | ICD-10-CM | POA: Diagnosis not present

## 2017-12-14 DIAGNOSIS — I10 Essential (primary) hypertension: Secondary | ICD-10-CM | POA: Diagnosis not present

## 2017-12-14 DIAGNOSIS — E7800 Pure hypercholesterolemia, unspecified: Secondary | ICD-10-CM | POA: Diagnosis not present

## 2017-12-28 DIAGNOSIS — E109 Type 1 diabetes mellitus without complications: Secondary | ICD-10-CM | POA: Diagnosis not present

## 2017-12-28 DIAGNOSIS — Z6827 Body mass index (BMI) 27.0-27.9, adult: Secondary | ICD-10-CM | POA: Diagnosis not present

## 2017-12-28 DIAGNOSIS — B356 Tinea cruris: Secondary | ICD-10-CM | POA: Diagnosis not present

## 2017-12-28 DIAGNOSIS — E663 Overweight: Secondary | ICD-10-CM | POA: Diagnosis not present

## 2017-12-28 DIAGNOSIS — Z1389 Encounter for screening for other disorder: Secondary | ICD-10-CM | POA: Diagnosis not present

## 2018-01-20 DIAGNOSIS — B351 Tinea unguium: Secondary | ICD-10-CM | POA: Diagnosis not present

## 2018-01-20 DIAGNOSIS — L851 Acquired keratosis [keratoderma] palmaris et plantaris: Secondary | ICD-10-CM | POA: Diagnosis not present

## 2018-01-20 DIAGNOSIS — E1142 Type 2 diabetes mellitus with diabetic polyneuropathy: Secondary | ICD-10-CM | POA: Diagnosis not present

## 2018-03-16 DIAGNOSIS — Z9641 Presence of insulin pump (external) (internal): Secondary | ICD-10-CM | POA: Diagnosis not present

## 2018-03-16 DIAGNOSIS — E109 Type 1 diabetes mellitus without complications: Secondary | ICD-10-CM | POA: Diagnosis not present

## 2018-03-16 DIAGNOSIS — I10 Essential (primary) hypertension: Secondary | ICD-10-CM | POA: Diagnosis not present

## 2018-03-16 DIAGNOSIS — E7800 Pure hypercholesterolemia, unspecified: Secondary | ICD-10-CM | POA: Diagnosis not present

## 2018-03-16 DIAGNOSIS — M2550 Pain in unspecified joint: Secondary | ICD-10-CM | POA: Diagnosis not present

## 2018-03-31 DIAGNOSIS — B351 Tinea unguium: Secondary | ICD-10-CM | POA: Diagnosis not present

## 2018-03-31 DIAGNOSIS — E1142 Type 2 diabetes mellitus with diabetic polyneuropathy: Secondary | ICD-10-CM | POA: Diagnosis not present

## 2018-03-31 DIAGNOSIS — L851 Acquired keratosis [keratoderma] palmaris et plantaris: Secondary | ICD-10-CM | POA: Diagnosis not present

## 2018-04-06 DIAGNOSIS — Z23 Encounter for immunization: Secondary | ICD-10-CM | POA: Diagnosis not present

## 2018-04-06 DIAGNOSIS — B880 Other acariasis: Secondary | ICD-10-CM | POA: Diagnosis not present

## 2018-04-06 DIAGNOSIS — Z6827 Body mass index (BMI) 27.0-27.9, adult: Secondary | ICD-10-CM | POA: Diagnosis not present

## 2018-04-06 DIAGNOSIS — Z1389 Encounter for screening for other disorder: Secondary | ICD-10-CM | POA: Diagnosis not present

## 2018-04-06 DIAGNOSIS — E663 Overweight: Secondary | ICD-10-CM | POA: Diagnosis not present

## 2018-04-06 DIAGNOSIS — T07XXXA Unspecified multiple injuries, initial encounter: Secondary | ICD-10-CM | POA: Diagnosis not present

## 2018-04-11 DIAGNOSIS — Z6826 Body mass index (BMI) 26.0-26.9, adult: Secondary | ICD-10-CM | POA: Diagnosis not present

## 2018-04-11 DIAGNOSIS — E663 Overweight: Secondary | ICD-10-CM | POA: Diagnosis not present

## 2018-04-11 DIAGNOSIS — H9201 Otalgia, right ear: Secondary | ICD-10-CM | POA: Diagnosis not present

## 2018-04-11 DIAGNOSIS — H9193 Unspecified hearing loss, bilateral: Secondary | ICD-10-CM | POA: Diagnosis not present

## 2018-04-11 DIAGNOSIS — E109 Type 1 diabetes mellitus without complications: Secondary | ICD-10-CM | POA: Diagnosis not present

## 2018-04-26 DIAGNOSIS — E119 Type 2 diabetes mellitus without complications: Secondary | ICD-10-CM | POA: Diagnosis not present

## 2018-04-26 DIAGNOSIS — H2513 Age-related nuclear cataract, bilateral: Secondary | ICD-10-CM | POA: Diagnosis not present

## 2018-04-26 DIAGNOSIS — H40013 Open angle with borderline findings, low risk, bilateral: Secondary | ICD-10-CM | POA: Diagnosis not present

## 2018-06-09 DIAGNOSIS — E1142 Type 2 diabetes mellitus with diabetic polyneuropathy: Secondary | ICD-10-CM | POA: Diagnosis not present

## 2018-06-09 DIAGNOSIS — L851 Acquired keratosis [keratoderma] palmaris et plantaris: Secondary | ICD-10-CM | POA: Diagnosis not present

## 2018-06-09 DIAGNOSIS — B351 Tinea unguium: Secondary | ICD-10-CM | POA: Diagnosis not present

## 2018-06-12 DIAGNOSIS — E109 Type 1 diabetes mellitus without complications: Secondary | ICD-10-CM | POA: Diagnosis not present

## 2018-06-12 DIAGNOSIS — E7800 Pure hypercholesterolemia, unspecified: Secondary | ICD-10-CM | POA: Diagnosis not present

## 2018-06-19 DIAGNOSIS — E7800 Pure hypercholesterolemia, unspecified: Secondary | ICD-10-CM | POA: Diagnosis not present

## 2018-06-19 DIAGNOSIS — Z9641 Presence of insulin pump (external) (internal): Secondary | ICD-10-CM | POA: Diagnosis not present

## 2018-06-19 DIAGNOSIS — I10 Essential (primary) hypertension: Secondary | ICD-10-CM | POA: Diagnosis not present

## 2018-06-19 DIAGNOSIS — E109 Type 1 diabetes mellitus without complications: Secondary | ICD-10-CM | POA: Diagnosis not present

## 2018-06-20 DIAGNOSIS — Z1389 Encounter for screening for other disorder: Secondary | ICD-10-CM | POA: Diagnosis not present

## 2018-06-20 DIAGNOSIS — L509 Urticaria, unspecified: Secondary | ICD-10-CM | POA: Diagnosis not present

## 2018-06-20 DIAGNOSIS — Z6826 Body mass index (BMI) 26.0-26.9, adult: Secondary | ICD-10-CM | POA: Diagnosis not present

## 2018-06-20 DIAGNOSIS — E663 Overweight: Secondary | ICD-10-CM | POA: Diagnosis not present

## 2018-08-18 DIAGNOSIS — E1142 Type 2 diabetes mellitus with diabetic polyneuropathy: Secondary | ICD-10-CM | POA: Diagnosis not present

## 2018-08-18 DIAGNOSIS — B351 Tinea unguium: Secondary | ICD-10-CM | POA: Diagnosis not present

## 2018-08-18 DIAGNOSIS — L851 Acquired keratosis [keratoderma] palmaris et plantaris: Secondary | ICD-10-CM | POA: Diagnosis not present

## 2018-08-24 DIAGNOSIS — L02229 Furuncle of trunk, unspecified: Secondary | ICD-10-CM | POA: Diagnosis not present

## 2018-08-24 DIAGNOSIS — L308 Other specified dermatitis: Secondary | ICD-10-CM | POA: Diagnosis not present

## 2018-08-24 DIAGNOSIS — B9689 Other specified bacterial agents as the cause of diseases classified elsewhere: Secondary | ICD-10-CM | POA: Diagnosis not present

## 2018-09-19 DIAGNOSIS — Z9641 Presence of insulin pump (external) (internal): Secondary | ICD-10-CM | POA: Diagnosis not present

## 2018-09-19 DIAGNOSIS — I10 Essential (primary) hypertension: Secondary | ICD-10-CM | POA: Diagnosis not present

## 2018-09-19 DIAGNOSIS — E7800 Pure hypercholesterolemia, unspecified: Secondary | ICD-10-CM | POA: Diagnosis not present

## 2018-09-19 DIAGNOSIS — E109 Type 1 diabetes mellitus without complications: Secondary | ICD-10-CM | POA: Diagnosis not present

## 2018-10-02 DIAGNOSIS — E663 Overweight: Secondary | ICD-10-CM | POA: Diagnosis not present

## 2018-10-02 DIAGNOSIS — Z6827 Body mass index (BMI) 27.0-27.9, adult: Secondary | ICD-10-CM | POA: Diagnosis not present

## 2018-10-02 DIAGNOSIS — M109 Gout, unspecified: Secondary | ICD-10-CM | POA: Diagnosis not present

## 2018-10-27 DIAGNOSIS — B351 Tinea unguium: Secondary | ICD-10-CM | POA: Diagnosis not present

## 2018-10-27 DIAGNOSIS — E1142 Type 2 diabetes mellitus with diabetic polyneuropathy: Secondary | ICD-10-CM | POA: Diagnosis not present

## 2018-10-27 DIAGNOSIS — L851 Acquired keratosis [keratoderma] palmaris et plantaris: Secondary | ICD-10-CM | POA: Diagnosis not present

## 2018-11-20 DIAGNOSIS — L308 Other specified dermatitis: Secondary | ICD-10-CM | POA: Diagnosis not present

## 2018-12-20 DIAGNOSIS — E7800 Pure hypercholesterolemia, unspecified: Secondary | ICD-10-CM | POA: Diagnosis not present

## 2018-12-20 DIAGNOSIS — Z23 Encounter for immunization: Secondary | ICD-10-CM | POA: Diagnosis not present

## 2018-12-20 DIAGNOSIS — I10 Essential (primary) hypertension: Secondary | ICD-10-CM | POA: Diagnosis not present

## 2018-12-20 DIAGNOSIS — E109 Type 1 diabetes mellitus without complications: Secondary | ICD-10-CM | POA: Diagnosis not present

## 2018-12-20 DIAGNOSIS — Z9641 Presence of insulin pump (external) (internal): Secondary | ICD-10-CM | POA: Diagnosis not present

## 2019-01-05 DIAGNOSIS — E1142 Type 2 diabetes mellitus with diabetic polyneuropathy: Secondary | ICD-10-CM | POA: Diagnosis not present

## 2019-01-05 DIAGNOSIS — B351 Tinea unguium: Secondary | ICD-10-CM | POA: Diagnosis not present

## 2019-01-05 DIAGNOSIS — L851 Acquired keratosis [keratoderma] palmaris et plantaris: Secondary | ICD-10-CM | POA: Diagnosis not present

## 2019-02-27 DIAGNOSIS — Z794 Long term (current) use of insulin: Secondary | ICD-10-CM | POA: Diagnosis not present

## 2019-02-27 DIAGNOSIS — E119 Type 2 diabetes mellitus without complications: Secondary | ICD-10-CM | POA: Diagnosis not present

## 2019-02-27 DIAGNOSIS — H40013 Open angle with borderline findings, low risk, bilateral: Secondary | ICD-10-CM | POA: Diagnosis not present

## 2019-02-27 DIAGNOSIS — H2513 Age-related nuclear cataract, bilateral: Secondary | ICD-10-CM | POA: Diagnosis not present

## 2019-03-16 DIAGNOSIS — B351 Tinea unguium: Secondary | ICD-10-CM | POA: Diagnosis not present

## 2019-03-16 DIAGNOSIS — E1142 Type 2 diabetes mellitus with diabetic polyneuropathy: Secondary | ICD-10-CM | POA: Diagnosis not present

## 2019-03-16 DIAGNOSIS — L851 Acquired keratosis [keratoderma] palmaris et plantaris: Secondary | ICD-10-CM | POA: Diagnosis not present

## 2019-03-20 DIAGNOSIS — R609 Edema, unspecified: Secondary | ICD-10-CM | POA: Diagnosis not present

## 2019-03-20 DIAGNOSIS — E871 Hypo-osmolality and hyponatremia: Secondary | ICD-10-CM | POA: Diagnosis not present

## 2019-03-20 DIAGNOSIS — Z9641 Presence of insulin pump (external) (internal): Secondary | ICD-10-CM | POA: Diagnosis not present

## 2019-03-20 DIAGNOSIS — I10 Essential (primary) hypertension: Secondary | ICD-10-CM | POA: Diagnosis not present

## 2019-03-20 DIAGNOSIS — E109 Type 1 diabetes mellitus without complications: Secondary | ICD-10-CM | POA: Diagnosis not present

## 2019-03-20 DIAGNOSIS — E7800 Pure hypercholesterolemia, unspecified: Secondary | ICD-10-CM | POA: Diagnosis not present

## 2019-03-26 DIAGNOSIS — Z6827 Body mass index (BMI) 27.0-27.9, adult: Secondary | ICD-10-CM | POA: Diagnosis not present

## 2019-03-26 DIAGNOSIS — E7849 Other hyperlipidemia: Secondary | ICD-10-CM | POA: Diagnosis not present

## 2019-03-26 DIAGNOSIS — I10 Essential (primary) hypertension: Secondary | ICD-10-CM | POA: Diagnosis not present

## 2019-03-26 DIAGNOSIS — E663 Overweight: Secondary | ICD-10-CM | POA: Diagnosis not present

## 2019-05-09 DIAGNOSIS — Z23 Encounter for immunization: Secondary | ICD-10-CM | POA: Diagnosis not present

## 2019-05-25 DIAGNOSIS — L851 Acquired keratosis [keratoderma] palmaris et plantaris: Secondary | ICD-10-CM | POA: Diagnosis not present

## 2019-05-25 DIAGNOSIS — B351 Tinea unguium: Secondary | ICD-10-CM | POA: Diagnosis not present

## 2019-05-25 DIAGNOSIS — E1142 Type 2 diabetes mellitus with diabetic polyneuropathy: Secondary | ICD-10-CM | POA: Diagnosis not present

## 2019-06-20 DIAGNOSIS — E109 Type 1 diabetes mellitus without complications: Secondary | ICD-10-CM | POA: Diagnosis not present

## 2019-06-20 DIAGNOSIS — Z9641 Presence of insulin pump (external) (internal): Secondary | ICD-10-CM | POA: Diagnosis not present

## 2019-06-20 DIAGNOSIS — E7800 Pure hypercholesterolemia, unspecified: Secondary | ICD-10-CM | POA: Diagnosis not present

## 2019-06-20 DIAGNOSIS — I10 Essential (primary) hypertension: Secondary | ICD-10-CM | POA: Diagnosis not present

## 2019-08-17 ENCOUNTER — Other Ambulatory Visit: Payer: Self-pay

## 2019-08-17 ENCOUNTER — Emergency Department (HOSPITAL_COMMUNITY)
Admission: EM | Admit: 2019-08-17 | Discharge: 2019-08-17 | Disposition: A | Discharge status: Home / Self Care | Payer: Medicare Other | Attending: Emergency Medicine | Admitting: Emergency Medicine

## 2019-08-17 ENCOUNTER — Encounter (HOSPITAL_COMMUNITY): Payer: Self-pay | Admitting: Emergency Medicine

## 2019-08-17 DIAGNOSIS — Z5321 Procedure and treatment not carried out due to patient leaving prior to being seen by health care provider: Secondary | ICD-10-CM | POA: Diagnosis not present

## 2019-08-17 DIAGNOSIS — R410 Disorientation, unspecified: Secondary | ICD-10-CM | POA: Diagnosis present

## 2019-08-17 LAB — URINALYSIS, ROUTINE W REFLEX MICROSCOPIC
Bilirubin Urine: NEGATIVE
Glucose, UA: 150 mg/dL — AB
Hgb urine dipstick: NEGATIVE
Ketones, ur: 20 mg/dL — AB
Leukocytes,Ua: NEGATIVE
Nitrite: NEGATIVE
Protein, ur: NEGATIVE mg/dL
Specific Gravity, Urine: 1.015 (ref 1.005–1.030)
pH: 6 (ref 5.0–8.0)

## 2019-08-17 LAB — CBG MONITORING, ED: Glucose-Capillary: 145 mg/dL — ABNORMAL HIGH (ref 70–99)

## 2019-08-17 NOTE — ED Triage Notes (Addendum)
Pt spouse reports pt blood sugar dropped this am. Pt spouse reports pt fell, spouse was able to get blood sugar within normal at home prior to arrival but reports confusion and difficulty performing basic tasks ever since. Pt ambulated into triage. Pt alert and oriented x4 at this time. Pt reports "I got dizzy this morning after getting up to quick" when asked what happened this am.   Pt is type 1 diabetic. Personal Pump is on left leg. Pt denies weakness, dizziness.   Pt spouse reports this is the second fall in several days. Pt denies being on any blood thinners.

## 2019-08-23 ENCOUNTER — Other Ambulatory Visit: Payer: Self-pay

## 2019-08-23 ENCOUNTER — Other Ambulatory Visit (HOSPITAL_COMMUNITY): Payer: Self-pay | Admitting: Family Medicine

## 2019-08-23 ENCOUNTER — Other Ambulatory Visit: Payer: Self-pay | Admitting: Family Medicine

## 2019-08-23 ENCOUNTER — Ambulatory Visit (HOSPITAL_COMMUNITY)
Admission: RE | Admit: 2019-08-23 | Discharge: 2019-08-23 | Disposition: A | Discharge status: Home / Self Care | Payer: Medicare Other | Source: Ambulatory Visit | Attending: Family Medicine | Admitting: Family Medicine

## 2019-08-23 DIAGNOSIS — Z1389 Encounter for screening for other disorder: Secondary | ICD-10-CM | POA: Diagnosis not present

## 2019-08-23 DIAGNOSIS — W19XXXA Unspecified fall, initial encounter: Secondary | ICD-10-CM | POA: Diagnosis not present

## 2019-08-23 DIAGNOSIS — S0990XA Unspecified injury of head, initial encounter: Secondary | ICD-10-CM | POA: Diagnosis not present

## 2019-08-23 DIAGNOSIS — E663 Overweight: Secondary | ICD-10-CM | POA: Diagnosis not present

## 2019-08-23 DIAGNOSIS — R55 Syncope and collapse: Secondary | ICD-10-CM | POA: Diagnosis not present

## 2019-08-23 DIAGNOSIS — Z6826 Body mass index (BMI) 26.0-26.9, adult: Secondary | ICD-10-CM | POA: Diagnosis not present

## 2019-08-23 DIAGNOSIS — Z0001 Encounter for general adult medical examination with abnormal findings: Secondary | ICD-10-CM | POA: Diagnosis not present

## 2019-08-31 DIAGNOSIS — H2513 Age-related nuclear cataract, bilateral: Secondary | ICD-10-CM | POA: Diagnosis not present

## 2019-08-31 DIAGNOSIS — H40013 Open angle with borderline findings, low risk, bilateral: Secondary | ICD-10-CM | POA: Diagnosis not present

## 2019-09-13 DIAGNOSIS — Z23 Encounter for immunization: Secondary | ICD-10-CM | POA: Diagnosis not present

## 2019-10-12 DIAGNOSIS — Z23 Encounter for immunization: Secondary | ICD-10-CM | POA: Diagnosis not present

## 2019-10-15 DIAGNOSIS — E663 Overweight: Secondary | ICD-10-CM | POA: Diagnosis not present

## 2019-10-15 DIAGNOSIS — L309 Dermatitis, unspecified: Secondary | ICD-10-CM | POA: Diagnosis not present

## 2019-10-15 DIAGNOSIS — R55 Syncope and collapse: Secondary | ICD-10-CM | POA: Diagnosis not present

## 2019-10-15 DIAGNOSIS — Z6826 Body mass index (BMI) 26.0-26.9, adult: Secondary | ICD-10-CM | POA: Diagnosis not present

## 2019-10-19 DIAGNOSIS — B351 Tinea unguium: Secondary | ICD-10-CM | POA: Diagnosis not present

## 2019-10-19 DIAGNOSIS — E1142 Type 2 diabetes mellitus with diabetic polyneuropathy: Secondary | ICD-10-CM | POA: Diagnosis not present

## 2019-10-19 DIAGNOSIS — L851 Acquired keratosis [keratoderma] palmaris et plantaris: Secondary | ICD-10-CM | POA: Diagnosis not present

## 2019-10-22 DIAGNOSIS — E108 Type 1 diabetes mellitus with unspecified complications: Secondary | ICD-10-CM | POA: Diagnosis not present

## 2019-10-22 DIAGNOSIS — R410 Disorientation, unspecified: Secondary | ICD-10-CM | POA: Diagnosis not present

## 2019-10-22 DIAGNOSIS — R55 Syncope and collapse: Secondary | ICD-10-CM | POA: Diagnosis not present

## 2019-10-26 DIAGNOSIS — R55 Syncope and collapse: Secondary | ICD-10-CM | POA: Diagnosis not present

## 2019-10-26 DIAGNOSIS — I10 Essential (primary) hypertension: Secondary | ICD-10-CM | POA: Diagnosis not present

## 2019-10-26 DIAGNOSIS — Z9641 Presence of insulin pump (external) (internal): Secondary | ICD-10-CM | POA: Diagnosis not present

## 2019-10-26 DIAGNOSIS — E109 Type 1 diabetes mellitus without complications: Secondary | ICD-10-CM | POA: Diagnosis not present

## 2019-10-26 DIAGNOSIS — E7800 Pure hypercholesterolemia, unspecified: Secondary | ICD-10-CM | POA: Diagnosis not present

## 2019-11-01 ENCOUNTER — Ambulatory Visit (HOSPITAL_COMMUNITY)
Admission: RE | Admit: 2019-11-01 | Discharge: 2019-11-01 | Disposition: A | Discharge status: Home / Self Care | Payer: Medicare Other | Source: Ambulatory Visit | Attending: Neurology | Admitting: Neurology

## 2019-11-01 ENCOUNTER — Other Ambulatory Visit: Payer: Self-pay

## 2019-11-01 DIAGNOSIS — Z79899 Other long term (current) drug therapy: Secondary | ICD-10-CM | POA: Diagnosis not present

## 2019-11-01 DIAGNOSIS — Z794 Long term (current) use of insulin: Secondary | ICD-10-CM | POA: Insufficient documentation

## 2019-11-01 DIAGNOSIS — Z7982 Long term (current) use of aspirin: Secondary | ICD-10-CM | POA: Diagnosis not present

## 2019-11-01 DIAGNOSIS — G9340 Encephalopathy, unspecified: Secondary | ICD-10-CM | POA: Insufficient documentation

## 2019-11-01 DIAGNOSIS — R569 Unspecified convulsions: Secondary | ICD-10-CM | POA: Diagnosis not present

## 2019-11-01 NOTE — Progress Notes (Signed)
EEG complete - results pending 

## 2019-11-01 NOTE — Procedures (Signed)
  [facility] Marina Desire A. Gerilyn Pilgrim, MD     [email or web address]           HISTORY: This is a 74 year old who presents with recurrent episodes of confusion worrisome for complex partial seizures.  MEDICATIONS:  Current Outpatient Medications:  .  amLODipine (NORVASC) 5 MG tablet, Take 5 mg by mouth daily., Disp: , Rfl:  .  aspirin EC 81 MG tablet, Take 81 mg by mouth daily., Disp: , Rfl:  .  COD LIVER OIL PO, Take 1 capsule by mouth daily., Disp: , Rfl:  .  diclofenac sodium (VOLTAREN) 1 % GEL, Apply 2 g topically 2 (two) times daily. Applies to Neck Area., Disp: , Rfl:  .  fish oil-omega-3 fatty acids 1000 MG capsule, Take 1 g by mouth daily., Disp: , Rfl:  .  insulin aspart (NOVOLOG) 100 UNIT/ML injection, Inject into the skin continuous. Patient has an insulin pump., Disp: , Rfl:  .  Multiple Vitamin (MULTIVITAMIN WITH MINERALS) TABS, Take 1 tablet by mouth daily., Disp: , Rfl:  .  pravastatin (PRAVACHOL) 10 MG tablet, Take 10 mg by mouth at bedtime., Disp: , Rfl:  .  ramipril (ALTACE) 10 MG capsule, Take 10 mg by mouth 2 (two) times daily., Disp: , Rfl:      ANALYSIS: A 16 channel recording using standard 10 20 measurements is conducted for 23 minutes.  There is a background activity of 7 hertz which attenuates with eye-opening. There is beta activity observed in the frontal areas. There is occasional global 2-4 hertz delta activity seen. Photic stimulation is carried out without abnormal changes in the background activity. Hyperventilation is not conducted. There is no focal or lateralized slowing. There is no epileptiform activity is noted.   IMPRESSION: 1. This recording shows mild global slowing indicating a mild global encephalopathy. No epileptiform activities are noted.      Scott Vanderveer A. Gerilyn Pilgrim, M.D.  Diplomate, Biomedical engineer of Psychiatry and Neurology ( Neurology).

## 2019-12-18 DIAGNOSIS — I10 Essential (primary) hypertension: Secondary | ICD-10-CM | POA: Diagnosis not present

## 2019-12-18 DIAGNOSIS — E108 Type 1 diabetes mellitus with unspecified complications: Secondary | ICD-10-CM | POA: Diagnosis not present

## 2019-12-18 DIAGNOSIS — E7800 Pure hypercholesterolemia, unspecified: Secondary | ICD-10-CM | POA: Diagnosis not present

## 2019-12-18 DIAGNOSIS — F4489 Other dissociative and conversion disorders: Secondary | ICD-10-CM | POA: Diagnosis not present

## 2019-12-18 DIAGNOSIS — Z9641 Presence of insulin pump (external) (internal): Secondary | ICD-10-CM | POA: Diagnosis not present

## 2019-12-18 DIAGNOSIS — G3184 Mild cognitive impairment, so stated: Secondary | ICD-10-CM | POA: Diagnosis not present

## 2019-12-18 DIAGNOSIS — R55 Syncope and collapse: Secondary | ICD-10-CM | POA: Diagnosis not present

## 2019-12-18 DIAGNOSIS — E109 Type 1 diabetes mellitus without complications: Secondary | ICD-10-CM | POA: Diagnosis not present

## 2019-12-19 DIAGNOSIS — L308 Other specified dermatitis: Secondary | ICD-10-CM | POA: Diagnosis not present

## 2019-12-27 DIAGNOSIS — L258 Unspecified contact dermatitis due to other agents: Secondary | ICD-10-CM | POA: Diagnosis not present

## 2019-12-27 DIAGNOSIS — L304 Erythema intertrigo: Secondary | ICD-10-CM | POA: Diagnosis not present

## 2020-01-01 DIAGNOSIS — H2513 Age-related nuclear cataract, bilateral: Secondary | ICD-10-CM | POA: Diagnosis not present

## 2020-01-01 DIAGNOSIS — E119 Type 2 diabetes mellitus without complications: Secondary | ICD-10-CM | POA: Diagnosis not present

## 2020-01-01 DIAGNOSIS — H40013 Open angle with borderline findings, low risk, bilateral: Secondary | ICD-10-CM | POA: Diagnosis not present

## 2020-01-04 DIAGNOSIS — B351 Tinea unguium: Secondary | ICD-10-CM | POA: Diagnosis not present

## 2020-01-04 DIAGNOSIS — E1142 Type 2 diabetes mellitus with diabetic polyneuropathy: Secondary | ICD-10-CM | POA: Diagnosis not present

## 2020-01-04 DIAGNOSIS — L851 Acquired keratosis [keratoderma] palmaris et plantaris: Secondary | ICD-10-CM | POA: Diagnosis not present

## 2020-03-12 DIAGNOSIS — E109 Type 1 diabetes mellitus without complications: Secondary | ICD-10-CM | POA: Diagnosis not present

## 2020-03-12 DIAGNOSIS — E1165 Type 2 diabetes mellitus with hyperglycemia: Secondary | ICD-10-CM | POA: Diagnosis not present

## 2020-03-12 DIAGNOSIS — E7849 Other hyperlipidemia: Secondary | ICD-10-CM | POA: Diagnosis not present

## 2020-03-12 DIAGNOSIS — E7800 Pure hypercholesterolemia, unspecified: Secondary | ICD-10-CM | POA: Diagnosis not present

## 2020-03-19 DIAGNOSIS — R55 Syncope and collapse: Secondary | ICD-10-CM | POA: Diagnosis not present

## 2020-03-19 DIAGNOSIS — E7800 Pure hypercholesterolemia, unspecified: Secondary | ICD-10-CM | POA: Diagnosis not present

## 2020-03-19 DIAGNOSIS — Z9641 Presence of insulin pump (external) (internal): Secondary | ICD-10-CM | POA: Diagnosis not present

## 2020-03-19 DIAGNOSIS — E109 Type 1 diabetes mellitus without complications: Secondary | ICD-10-CM | POA: Diagnosis not present

## 2020-03-19 DIAGNOSIS — I10 Essential (primary) hypertension: Secondary | ICD-10-CM | POA: Diagnosis not present

## 2020-03-25 DIAGNOSIS — B351 Tinea unguium: Secondary | ICD-10-CM | POA: Diagnosis not present

## 2020-03-25 DIAGNOSIS — E1142 Type 2 diabetes mellitus with diabetic polyneuropathy: Secondary | ICD-10-CM | POA: Diagnosis not present

## 2020-03-25 DIAGNOSIS — L851 Acquired keratosis [keratoderma] palmaris et plantaris: Secondary | ICD-10-CM | POA: Diagnosis not present

## 2020-06-03 DIAGNOSIS — E1142 Type 2 diabetes mellitus with diabetic polyneuropathy: Secondary | ICD-10-CM | POA: Diagnosis not present

## 2020-06-03 DIAGNOSIS — B351 Tinea unguium: Secondary | ICD-10-CM | POA: Diagnosis not present

## 2020-06-03 DIAGNOSIS — L851 Acquired keratosis [keratoderma] palmaris et plantaris: Secondary | ICD-10-CM | POA: Diagnosis not present

## 2020-06-03 DIAGNOSIS — Z23 Encounter for immunization: Secondary | ICD-10-CM | POA: Diagnosis not present

## 2020-06-19 DIAGNOSIS — Z9641 Presence of insulin pump (external) (internal): Secondary | ICD-10-CM | POA: Diagnosis not present

## 2020-06-19 DIAGNOSIS — R55 Syncope and collapse: Secondary | ICD-10-CM | POA: Diagnosis not present

## 2020-06-19 DIAGNOSIS — I10 Essential (primary) hypertension: Secondary | ICD-10-CM | POA: Diagnosis not present

## 2020-06-19 DIAGNOSIS — E109 Type 1 diabetes mellitus without complications: Secondary | ICD-10-CM | POA: Diagnosis not present

## 2020-06-19 DIAGNOSIS — E7800 Pure hypercholesterolemia, unspecified: Secondary | ICD-10-CM | POA: Diagnosis not present

## 2020-06-30 DIAGNOSIS — H40013 Open angle with borderline findings, low risk, bilateral: Secondary | ICD-10-CM | POA: Diagnosis not present

## 2020-06-30 DIAGNOSIS — H2513 Age-related nuclear cataract, bilateral: Secondary | ICD-10-CM | POA: Diagnosis not present

## 2020-06-30 DIAGNOSIS — E119 Type 2 diabetes mellitus without complications: Secondary | ICD-10-CM | POA: Diagnosis not present

## 2020-07-01 DIAGNOSIS — Z23 Encounter for immunization: Secondary | ICD-10-CM | POA: Diagnosis not present

## 2020-07-06 IMAGING — CT CT HEAD W/O CM
3 series · 16 of 47 positions shown, 19 images · non-contrast
Comparison: Report from MRI brain from 10/09/2001

CLINICAL DATA: Syncope.

EXAM:
CT HEAD WITHOUT CONTRAST
TECHNIQUE: Contiguous axial images were obtained from the base of the skull
through the vertex without intravenous contrast.

[Series 2: head w o · axial · 0.43mm/px · z∈[+34,+164]mm · 10 of 32 slices shown, 13 images]
[im 3/32  brain]
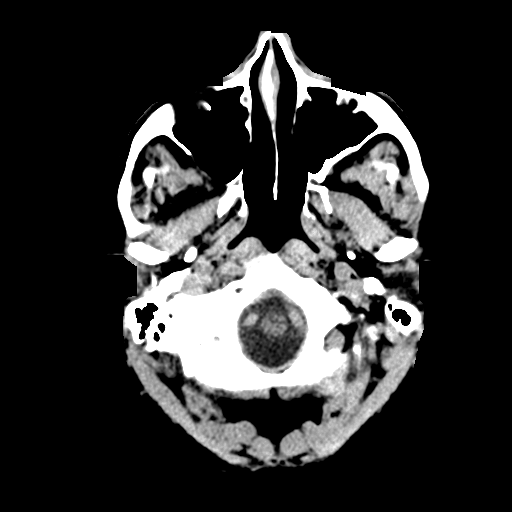
[im 3/32  bone]
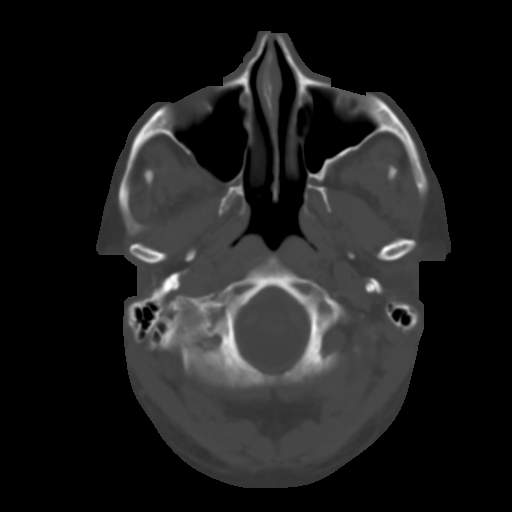
[im 6/32  brain]
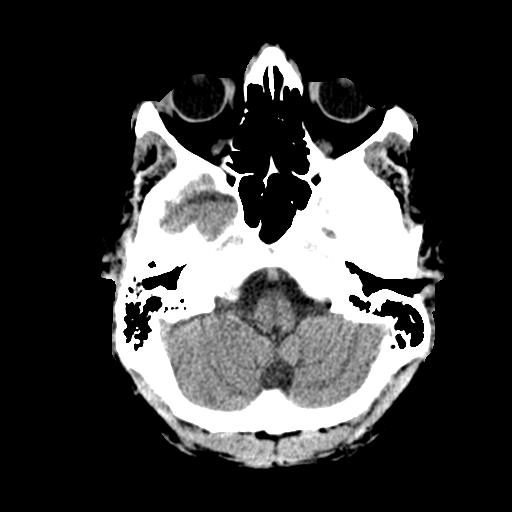
[im 9/32  brain]
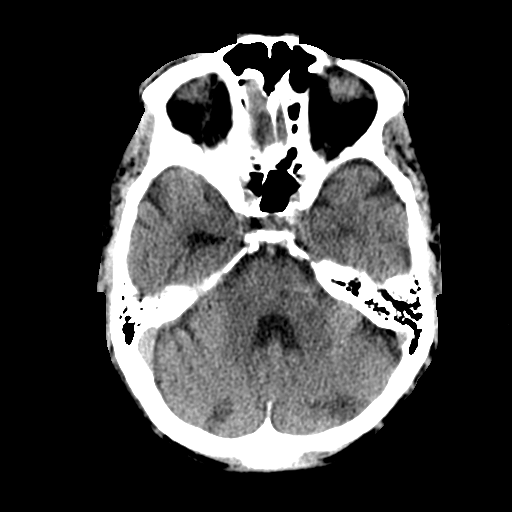
[im 11/32  brain]
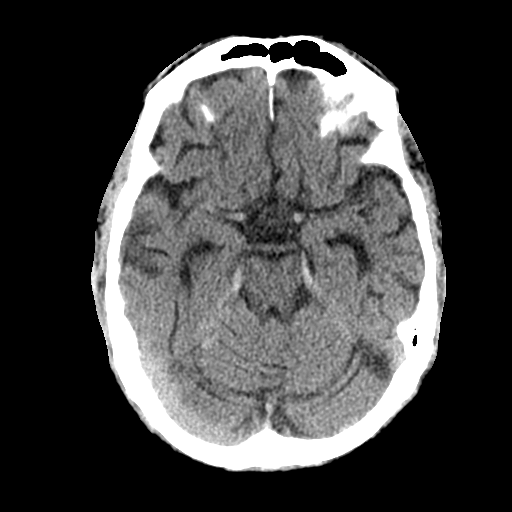
[im 14/32  brain]
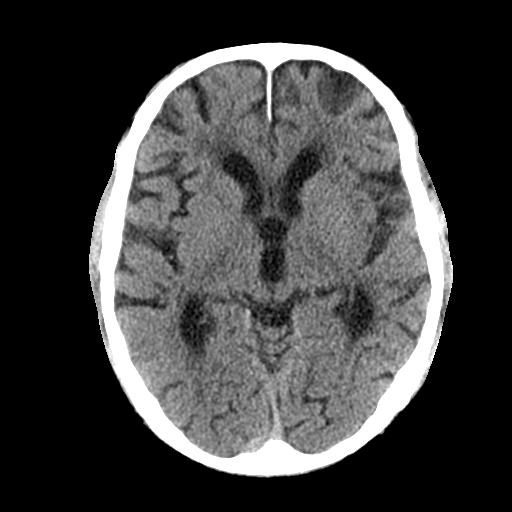
[im 14/32  bone]
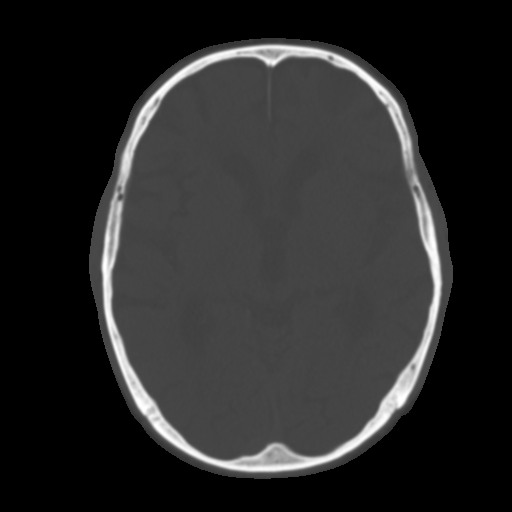
[im 18/32  brain]
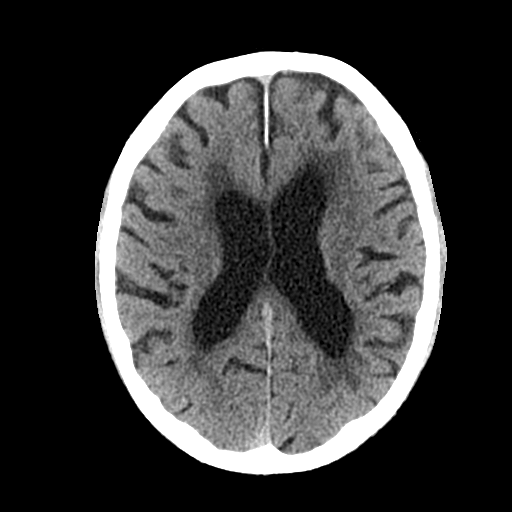
[im 21/32  brain]
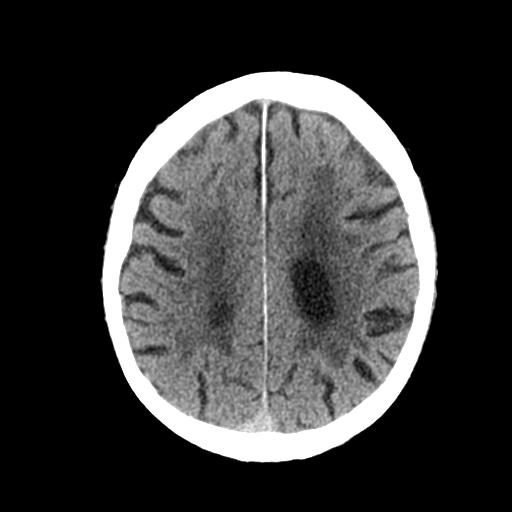
[im 24/32  brain]
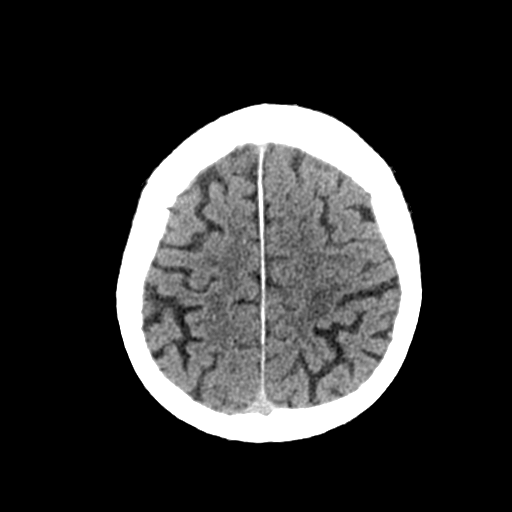
[im 26/32  brain]
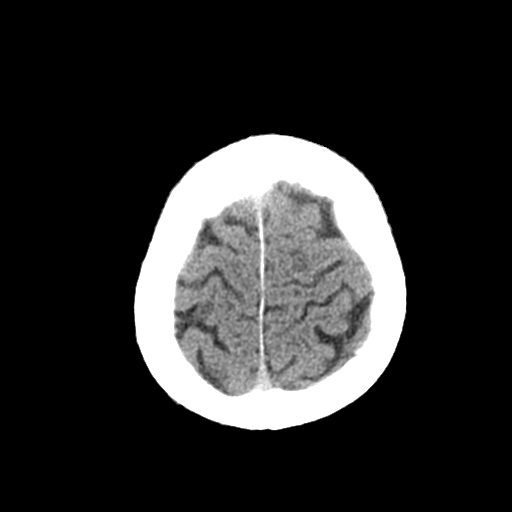
[im 26/32  bone]
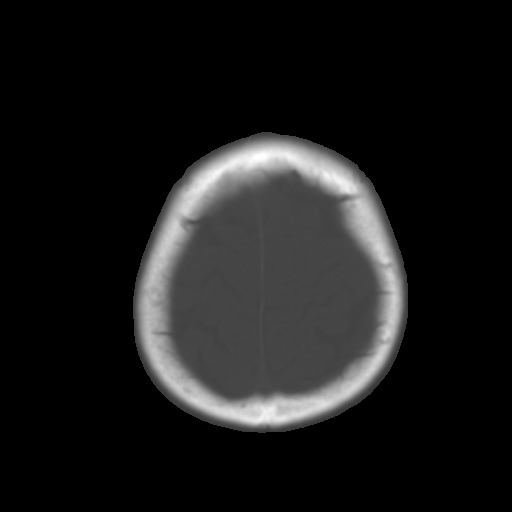
[im 29/32  brain]
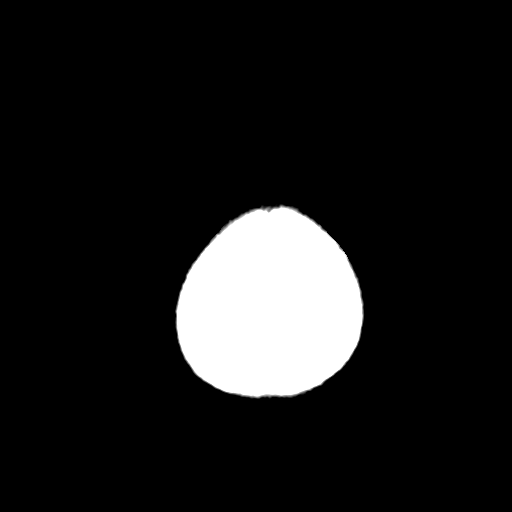

[Series 4: coronal soft · coronal · 0.34mm/px · 3 of 79 slices shown]
[im 27/79  brain]
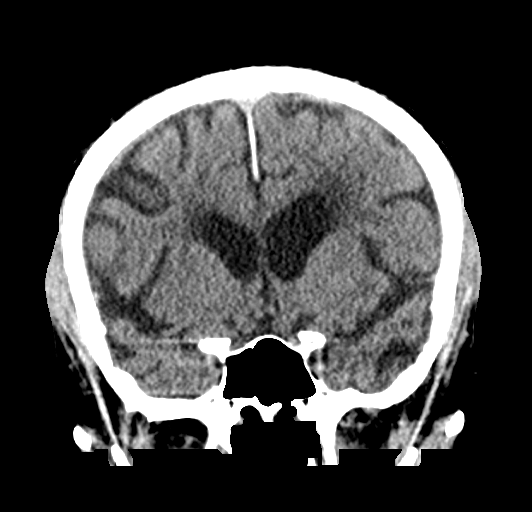
[im 35/79  brain]
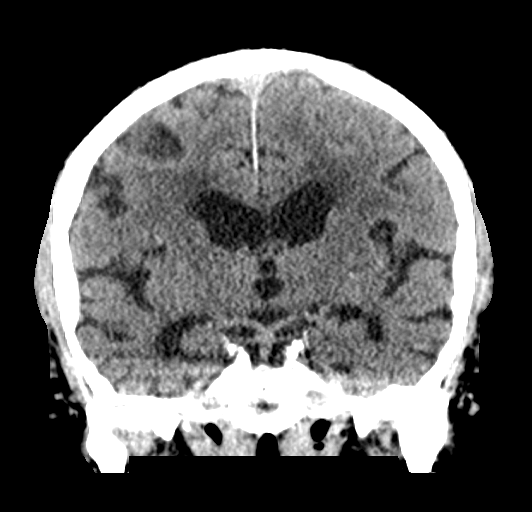
[im 44/79  brain]
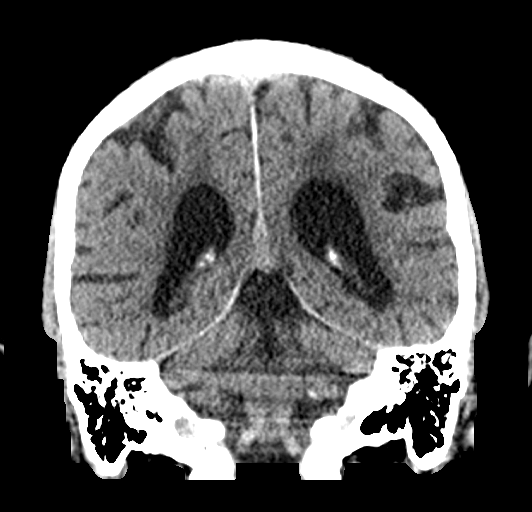

[Series 5: sagittal soft · sagittal · 0.35mm/px · 3 of 67 slices shown]
[im 23/67  brain]
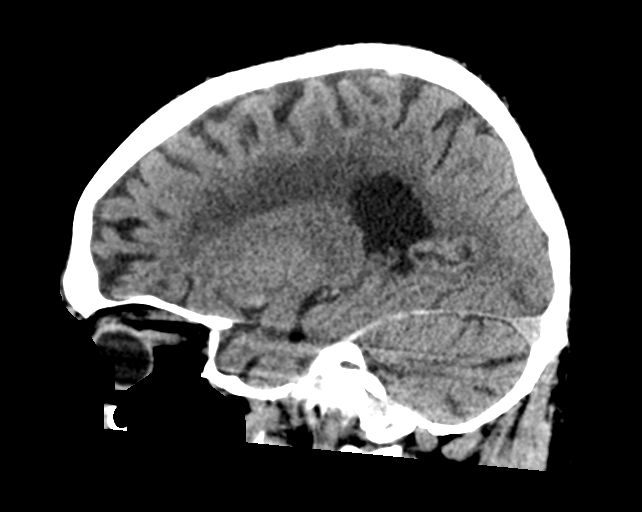
[im 34/67  brain]
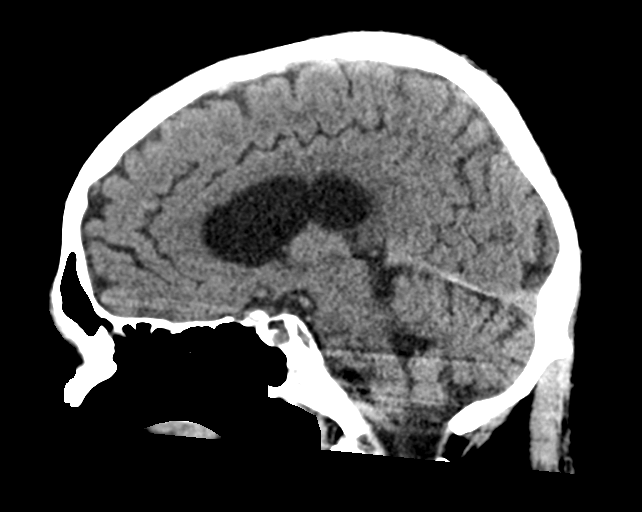
[im 45/67  brain]
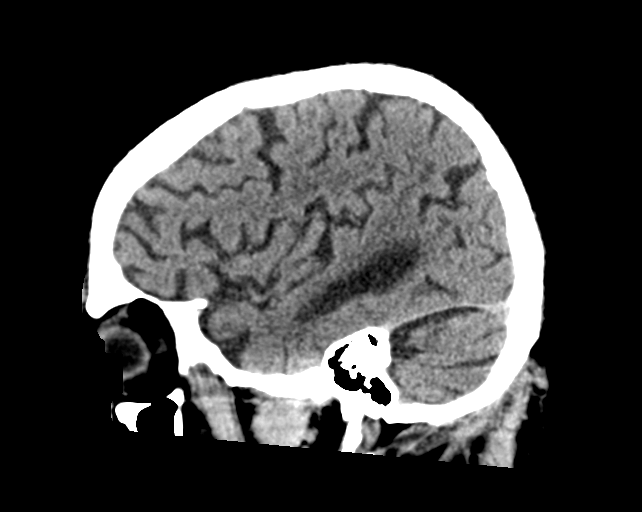

[16 of 47 positions shown; findings below may reference images not displayed]

FINDINGS: Brain: Questionable 3 mm lucency in the left lower pons but could be
artifactual.

Periventricular white matter and corona radiata hypodensities favor
chronic ischemic microvascular white matter disease.

Otherwise, the brainstem, cerebellum, cerebral peduncles, thalamus,
basal ganglia, basilar cisterns, and ventricular system appear
within normal limits. No intracranial hemorrhage, mass lesion, or
acute CVA.

Vascular: There is atherosclerotic calcification of the cavernous
carotid arteries bilaterally.

Skull: Unremarkable

Sinuses/Orbits: Mild chronic ethmoid sinusitis.

Other: No supplemental non-categorized findings.
IMPRESSION: 1. No acute intracranial findings.
2. Periventricular white matter and corona radiata hypodensities
favor chronic ischemic microvascular white matter disease.
3. Questionable 3 mm lucency in the left lower pons, artifact versus
remote lacunar infarct.
4. Mild chronic ethmoid sinusitis.

## 2020-09-09 DIAGNOSIS — E119 Type 2 diabetes mellitus without complications: Secondary | ICD-10-CM | POA: Diagnosis not present

## 2020-09-09 DIAGNOSIS — Z6826 Body mass index (BMI) 26.0-26.9, adult: Secondary | ICD-10-CM | POA: Diagnosis not present

## 2020-09-09 DIAGNOSIS — L309 Dermatitis, unspecified: Secondary | ICD-10-CM | POA: Diagnosis not present

## 2020-09-19 DIAGNOSIS — E7800 Pure hypercholesterolemia, unspecified: Secondary | ICD-10-CM | POA: Diagnosis not present

## 2020-09-19 DIAGNOSIS — R55 Syncope and collapse: Secondary | ICD-10-CM | POA: Diagnosis not present

## 2020-09-19 DIAGNOSIS — Z9641 Presence of insulin pump (external) (internal): Secondary | ICD-10-CM | POA: Diagnosis not present

## 2020-09-19 DIAGNOSIS — E119 Type 2 diabetes mellitus without complications: Secondary | ICD-10-CM | POA: Diagnosis not present

## 2020-09-19 DIAGNOSIS — I10 Essential (primary) hypertension: Secondary | ICD-10-CM | POA: Diagnosis not present

## 2020-09-19 DIAGNOSIS — E109 Type 1 diabetes mellitus without complications: Secondary | ICD-10-CM | POA: Diagnosis not present

## 2020-10-13 DIAGNOSIS — M109 Gout, unspecified: Secondary | ICD-10-CM | POA: Diagnosis not present

## 2020-10-13 DIAGNOSIS — Z0001 Encounter for general adult medical examination with abnormal findings: Secondary | ICD-10-CM | POA: Diagnosis not present

## 2020-10-13 DIAGNOSIS — Z6826 Body mass index (BMI) 26.0-26.9, adult: Secondary | ICD-10-CM | POA: Diagnosis not present

## 2020-10-13 DIAGNOSIS — Z1331 Encounter for screening for depression: Secondary | ICD-10-CM | POA: Diagnosis not present

## 2020-10-13 DIAGNOSIS — E663 Overweight: Secondary | ICD-10-CM | POA: Diagnosis not present

## 2020-10-21 DIAGNOSIS — L84 Corns and callosities: Secondary | ICD-10-CM | POA: Diagnosis not present

## 2020-10-21 DIAGNOSIS — E1142 Type 2 diabetes mellitus with diabetic polyneuropathy: Secondary | ICD-10-CM | POA: Diagnosis not present

## 2020-10-21 DIAGNOSIS — B351 Tinea unguium: Secondary | ICD-10-CM | POA: Diagnosis not present

## 2020-10-30 DIAGNOSIS — B029 Zoster without complications: Secondary | ICD-10-CM | POA: Diagnosis not present

## 2020-10-30 DIAGNOSIS — Z6826 Body mass index (BMI) 26.0-26.9, adult: Secondary | ICD-10-CM | POA: Diagnosis not present

## 2020-10-30 DIAGNOSIS — E663 Overweight: Secondary | ICD-10-CM | POA: Diagnosis not present

## 2020-11-10 DIAGNOSIS — E663 Overweight: Secondary | ICD-10-CM | POA: Diagnosis not present

## 2020-11-10 DIAGNOSIS — L0390 Cellulitis, unspecified: Secondary | ICD-10-CM | POA: Diagnosis not present

## 2020-11-10 DIAGNOSIS — Z6826 Body mass index (BMI) 26.0-26.9, adult: Secondary | ICD-10-CM | POA: Diagnosis not present

## 2020-12-17 DIAGNOSIS — R413 Other amnesia: Secondary | ICD-10-CM | POA: Diagnosis not present

## 2020-12-17 DIAGNOSIS — E109 Type 1 diabetes mellitus without complications: Secondary | ICD-10-CM | POA: Diagnosis not present

## 2020-12-17 DIAGNOSIS — I10 Essential (primary) hypertension: Secondary | ICD-10-CM | POA: Diagnosis not present

## 2020-12-17 DIAGNOSIS — R55 Syncope and collapse: Secondary | ICD-10-CM | POA: Diagnosis not present

## 2020-12-17 DIAGNOSIS — E119 Type 2 diabetes mellitus without complications: Secondary | ICD-10-CM | POA: Diagnosis not present

## 2020-12-17 DIAGNOSIS — E7800 Pure hypercholesterolemia, unspecified: Secondary | ICD-10-CM | POA: Diagnosis not present

## 2020-12-17 DIAGNOSIS — Z9641 Presence of insulin pump (external) (internal): Secondary | ICD-10-CM | POA: Diagnosis not present

## 2020-12-30 DIAGNOSIS — H2513 Age-related nuclear cataract, bilateral: Secondary | ICD-10-CM | POA: Diagnosis not present

## 2020-12-30 DIAGNOSIS — E119 Type 2 diabetes mellitus without complications: Secondary | ICD-10-CM | POA: Diagnosis not present

## 2020-12-30 DIAGNOSIS — H40013 Open angle with borderline findings, low risk, bilateral: Secondary | ICD-10-CM | POA: Diagnosis not present

## 2021-01-06 DIAGNOSIS — L84 Corns and callosities: Secondary | ICD-10-CM | POA: Diagnosis not present

## 2021-01-06 DIAGNOSIS — E1142 Type 2 diabetes mellitus with diabetic polyneuropathy: Secondary | ICD-10-CM | POA: Diagnosis not present

## 2021-01-06 DIAGNOSIS — B351 Tinea unguium: Secondary | ICD-10-CM | POA: Diagnosis not present

## 2021-01-29 DIAGNOSIS — E663 Overweight: Secondary | ICD-10-CM | POA: Diagnosis not present

## 2021-01-29 DIAGNOSIS — K209 Esophagitis, unspecified without bleeding: Secondary | ICD-10-CM | POA: Diagnosis not present

## 2021-01-29 DIAGNOSIS — Z6825 Body mass index (BMI) 25.0-25.9, adult: Secondary | ICD-10-CM | POA: Diagnosis not present

## 2021-01-29 DIAGNOSIS — E119 Type 2 diabetes mellitus without complications: Secondary | ICD-10-CM | POA: Diagnosis not present

## 2021-03-18 DIAGNOSIS — E109 Type 1 diabetes mellitus without complications: Secondary | ICD-10-CM | POA: Diagnosis not present

## 2021-03-18 DIAGNOSIS — R413 Other amnesia: Secondary | ICD-10-CM | POA: Diagnosis not present

## 2021-03-18 DIAGNOSIS — E7800 Pure hypercholesterolemia, unspecified: Secondary | ICD-10-CM | POA: Diagnosis not present

## 2021-03-20 DIAGNOSIS — L84 Corns and callosities: Secondary | ICD-10-CM | POA: Diagnosis not present

## 2021-03-20 DIAGNOSIS — B351 Tinea unguium: Secondary | ICD-10-CM | POA: Diagnosis not present

## 2021-03-20 DIAGNOSIS — E1142 Type 2 diabetes mellitus with diabetic polyneuropathy: Secondary | ICD-10-CM | POA: Diagnosis not present

## 2021-03-25 DIAGNOSIS — E7800 Pure hypercholesterolemia, unspecified: Secondary | ICD-10-CM | POA: Diagnosis not present

## 2021-03-25 DIAGNOSIS — E109 Type 1 diabetes mellitus without complications: Secondary | ICD-10-CM | POA: Diagnosis not present

## 2021-03-25 DIAGNOSIS — E871 Hypo-osmolality and hyponatremia: Secondary | ICD-10-CM | POA: Diagnosis not present

## 2021-03-25 DIAGNOSIS — I10 Essential (primary) hypertension: Secondary | ICD-10-CM | POA: Diagnosis not present

## 2021-03-25 DIAGNOSIS — R55 Syncope and collapse: Secondary | ICD-10-CM | POA: Diagnosis not present

## 2021-03-25 DIAGNOSIS — Z9641 Presence of insulin pump (external) (internal): Secondary | ICD-10-CM | POA: Diagnosis not present

## 2021-03-25 DIAGNOSIS — R413 Other amnesia: Secondary | ICD-10-CM | POA: Diagnosis not present

## 2021-04-08 DIAGNOSIS — E109 Type 1 diabetes mellitus without complications: Secondary | ICD-10-CM | POA: Diagnosis not present

## 2021-04-15 DIAGNOSIS — B355 Tinea imbricata: Secondary | ICD-10-CM | POA: Diagnosis not present

## 2021-04-15 DIAGNOSIS — E663 Overweight: Secondary | ICD-10-CM | POA: Diagnosis not present

## 2021-04-15 DIAGNOSIS — Z6825 Body mass index (BMI) 25.0-25.9, adult: Secondary | ICD-10-CM | POA: Diagnosis not present

## 2021-04-23 DIAGNOSIS — E663 Overweight: Secondary | ICD-10-CM | POA: Diagnosis not present

## 2021-04-23 DIAGNOSIS — E119 Type 2 diabetes mellitus without complications: Secondary | ICD-10-CM | POA: Diagnosis not present

## 2021-04-23 DIAGNOSIS — B356 Tinea cruris: Secondary | ICD-10-CM | POA: Diagnosis not present

## 2021-04-24 DIAGNOSIS — E11622 Type 2 diabetes mellitus with other skin ulcer: Secondary | ICD-10-CM | POA: Diagnosis not present

## 2021-04-24 DIAGNOSIS — L304 Erythema intertrigo: Secondary | ICD-10-CM | POA: Diagnosis not present

## 2021-04-30 DIAGNOSIS — L304 Erythema intertrigo: Secondary | ICD-10-CM | POA: Diagnosis not present

## 2021-05-21 DIAGNOSIS — L304 Erythema intertrigo: Secondary | ICD-10-CM | POA: Diagnosis not present

## 2021-05-21 DIAGNOSIS — Z23 Encounter for immunization: Secondary | ICD-10-CM | POA: Diagnosis not present

## 2021-06-02 DIAGNOSIS — B351 Tinea unguium: Secondary | ICD-10-CM | POA: Diagnosis not present

## 2021-06-02 DIAGNOSIS — L84 Corns and callosities: Secondary | ICD-10-CM | POA: Diagnosis not present

## 2021-06-02 DIAGNOSIS — E1142 Type 2 diabetes mellitus with diabetic polyneuropathy: Secondary | ICD-10-CM | POA: Diagnosis not present

## 2021-06-23 DIAGNOSIS — E871 Hypo-osmolality and hyponatremia: Secondary | ICD-10-CM | POA: Diagnosis not present

## 2021-06-23 DIAGNOSIS — I10 Essential (primary) hypertension: Secondary | ICD-10-CM | POA: Diagnosis not present

## 2021-06-23 DIAGNOSIS — R413 Other amnesia: Secondary | ICD-10-CM | POA: Diagnosis not present

## 2021-06-23 DIAGNOSIS — R55 Syncope and collapse: Secondary | ICD-10-CM | POA: Diagnosis not present

## 2021-06-23 DIAGNOSIS — E7800 Pure hypercholesterolemia, unspecified: Secondary | ICD-10-CM | POA: Diagnosis not present

## 2021-06-23 DIAGNOSIS — Z9641 Presence of insulin pump (external) (internal): Secondary | ICD-10-CM | POA: Diagnosis not present

## 2021-06-23 DIAGNOSIS — E109 Type 1 diabetes mellitus without complications: Secondary | ICD-10-CM | POA: Diagnosis not present

## 2021-06-30 DIAGNOSIS — H40013 Open angle with borderline findings, low risk, bilateral: Secondary | ICD-10-CM | POA: Diagnosis not present

## 2021-06-30 DIAGNOSIS — H2513 Age-related nuclear cataract, bilateral: Secondary | ICD-10-CM | POA: Diagnosis not present

## 2021-06-30 DIAGNOSIS — E119 Type 2 diabetes mellitus without complications: Secondary | ICD-10-CM | POA: Diagnosis not present

## 2021-07-22 ENCOUNTER — Other Ambulatory Visit: Payer: Self-pay

## 2021-07-22 ENCOUNTER — Encounter (HOSPITAL_COMMUNITY): Payer: Self-pay

## 2021-07-22 ENCOUNTER — Inpatient Hospital Stay (HOSPITAL_COMMUNITY)
Admission: EM | Admit: 2021-07-22 | Discharge: 2021-07-24 | DRG: 645 | Disposition: A | Discharge status: Home / Self Care | Payer: Medicare Other | Attending: Family Medicine | Admitting: Family Medicine

## 2021-07-22 DIAGNOSIS — R55 Syncope and collapse: Secondary | ICD-10-CM

## 2021-07-22 DIAGNOSIS — E860 Dehydration: Secondary | ICD-10-CM | POA: Diagnosis present

## 2021-07-22 DIAGNOSIS — E7800 Pure hypercholesterolemia, unspecified: Secondary | ICD-10-CM | POA: Diagnosis present

## 2021-07-22 DIAGNOSIS — E160 Drug-induced hypoglycemia without coma: Principal | ICD-10-CM | POA: Diagnosis present

## 2021-07-22 DIAGNOSIS — Z20822 Contact with and (suspected) exposure to covid-19: Secondary | ICD-10-CM | POA: Diagnosis not present

## 2021-07-22 DIAGNOSIS — E162 Hypoglycemia, unspecified: Secondary | ICD-10-CM | POA: Diagnosis not present

## 2021-07-22 DIAGNOSIS — E11649 Type 2 diabetes mellitus with hypoglycemia without coma: Secondary | ICD-10-CM | POA: Diagnosis not present

## 2021-07-22 DIAGNOSIS — Z9641 Presence of insulin pump (external) (internal): Secondary | ICD-10-CM | POA: Diagnosis present

## 2021-07-22 DIAGNOSIS — I10 Essential (primary) hypertension: Secondary | ICD-10-CM | POA: Diagnosis not present

## 2021-07-22 DIAGNOSIS — T383X5A Adverse effect of insulin and oral hypoglycemic [antidiabetic] drugs, initial encounter: Secondary | ICD-10-CM | POA: Diagnosis not present

## 2021-07-22 DIAGNOSIS — R5381 Other malaise: Secondary | ICD-10-CM | POA: Diagnosis not present

## 2021-07-22 DIAGNOSIS — D72829 Elevated white blood cell count, unspecified: Secondary | ICD-10-CM

## 2021-07-22 DIAGNOSIS — Z888 Allergy status to other drugs, medicaments and biological substances status: Secondary | ICD-10-CM

## 2021-07-22 DIAGNOSIS — Z79899 Other long term (current) drug therapy: Secondary | ICD-10-CM

## 2021-07-22 DIAGNOSIS — Z7982 Long term (current) use of aspirin: Secondary | ICD-10-CM

## 2021-07-22 DIAGNOSIS — Z794 Long term (current) use of insulin: Secondary | ICD-10-CM

## 2021-07-22 LAB — BASIC METABOLIC PANEL
Anion gap: 8 (ref 5–15)
BUN: 12 mg/dL (ref 8–23)
CO2: 25 mmol/L (ref 22–32)
Calcium: 9.1 mg/dL (ref 8.9–10.3)
Chloride: 93 mmol/L — ABNORMAL LOW (ref 98–111)
Creatinine, Ser: 0.67 mg/dL (ref 0.61–1.24)
GFR, Estimated: >60 mL/min (ref >60)
Glucose, Bld: 53 mg/dL — ABNORMAL LOW (ref 70–99)
Potassium: 3.5 mmol/L (ref 3.5–5.1)
Sodium: 126 mmol/L — ABNORMAL LOW (ref 135–145)

## 2021-07-22 LAB — CBG MONITORING, ED
Glucose-Capillary: 59 mg/dL — ABNORMAL LOW (ref 70–99)
Glucose-Capillary: 63 mg/dL — ABNORMAL LOW (ref 70–99)
Glucose-Capillary: 80 mg/dL (ref 70–99)
Glucose-Capillary: 87 mg/dL (ref 70–99)

## 2021-07-22 LAB — CBC
HCT: 39.8 % (ref 39.0–52.0)
Hemoglobin: 13.9 g/dL (ref 13.0–17.0)
MCH: 31.2 pg (ref 26.0–34.0)
MCHC: 34.9 g/dL (ref 30.0–36.0)
MCV: 89.4 fL (ref 80.0–100.0)
Platelets: 267 10*3/uL (ref 150–400)
RBC: 4.45 MIL/uL (ref 4.22–5.81)
RDW: 13.4 % (ref 11.5–15.5)
WBC: 7.6 10*3/uL (ref 4.0–10.5)
nRBC: 0 % (ref 0.0–0.2)

## 2021-07-22 MED ORDER — GLUCOSE 40 % PO GEL
1.0000 | Freq: Once | ORAL | Status: AC
  Administered 2021-07-22: 22:00:00 31 g via ORAL
  Filled 2021-07-22: qty 1

## 2021-07-22 MED ORDER — DEXTROSE 10 % IV SOLN
100.0000 mL | Freq: Once | INTRAVENOUS | Status: AC
  Administered 2021-07-22: 22:00:00 100 mL via INTRAVENOUS

## 2021-07-22 NOTE — ED Provider Notes (Signed)
°  Provider Note MRN:  [id number]  Arrival date & time: 07/22/21    ED Course and Medical Decision Making  Assumed care from Dr. Lynelle Doctor at shift change.  Patient here out of concern because he thinks his insulin pump accidentally gave him 200 units of insulin.  Has had a few blood glucose measurements in the 50s, largely asymptomatic, will observe in the emergency department for a few hours.  On D10 drip.   Patient with multiple hypoglycemic events into the 50s despite D10 drip, will admit to medicine for further monitoring.  .Critical Care Performed by: Sabas Sous, MD Authorized by: Sabas Sous, MD   Critical care provider statement:    Critical care time (minutes):  31   Critical care was necessary to treat or prevent imminent or life-threatening deterioration of the following conditions:  Metabolic crisis   Critical care was time spent personally by me on the following activities:  Development of treatment plan with patient or surrogate, discussions with consultants, evaluation of patient's response to treatment, examination of patient, ordering and review of laboratory studies, ordering and review of radiographic studies, ordering and performing treatments and interventions, pulse oximetry, re-evaluation of patient's condition and review of old charts  Final Clinical Impressions(s) / ED Diagnoses     ICD-10-CM   1. Hypoglycemia  E16.2       ED Discharge Orders     None       Discharge Instructions   None     Elmer Sow. Pilar Plate, MD [facility] Emergency Medicine [facility] [email or web address]    Sabas Sous, MD 07/23/21 [phone]

## 2021-07-22 NOTE — ED Provider Notes (Signed)
[facility] EMERGENCY DEPARTMENT Provider Note   CSN: [id number] Arrival date & time: 07/22/21  2120     History Chief Complaint  Patient presents with   Hypoglycemia    Scott Garner is a 75 y.o. male.   Hypoglycemia  Patient presents to the ED for evaluation of hypoglycemia.  Patient states he was changing his insulin pump and he thinks it may have accidentally injected him with a bolus of insulin.  Patient thinks the entire vial may have been administered.  Per the EMS report that was possibly 200 units of insulin.  He is not really sure but he did not find that his clothing was wet.  Patient drank 2 cups of juice.  His blood sugar was initially 57.  He called EMS and his blood sugar was up to 98.  Patient states he feels fine.  He did not lose consciousness.  He is not having any chest pain or shortness of breath.  No vomiting or diarrhea.  Past Medical History:  Diagnosis Date   Diabetes mellitus without complication (HCC)    Hypercholesteremia    Hypertension     Patient Active Problem List   Diagnosis Date Noted   Neck pain on left side 04/21/2012   Stiffness of joints, not elsewhere classified, multiple sites 04/21/2012    Past Surgical History:  Procedure Laterality Date   COLONOSCOPY     COLONOSCOPY N/A 10/24/2012   Procedure: COLONOSCOPY;  Surgeon: Dalia Heading, MD;  Location: [facility];  Service: Gastroenterology;  Laterality: N/A;   HERNIA REPAIR     NASAL SINUS SURGERY         Family History  Problem Relation Age of Onset   Colon cancer Brother     Social History   Tobacco Use   Smoking status: Never  Substance Use Topics   Alcohol use: No   Drug use: No    Home Medications Prior to Admission medications   Medication Sig Start Date End Date Taking? Authorizing Provider  amLODipine (NORVASC) 5 MG tablet Take 5 mg by mouth daily.    [provider]  aspirin EC 81 MG tablet Take 81 mg by mouth daily.    [provider]   COD LIVER OIL PO Take 1 capsule by mouth daily.    [provider]  diclofenac sodium (VOLTAREN) 1 % GEL Apply 2 g topically 2 (two) times daily. Applies to Neck Area.    [provider]  fish oil-omega-3 fatty acids 1000 MG capsule Take 1 g by mouth daily.    [provider]  insulin aspart (NOVOLOG) 100 UNIT/ML injection Inject into the skin continuous. Patient has an insulin pump.    [provider]  Multiple Vitamin (MULTIVITAMIN WITH MINERALS) TABS Take 1 tablet by mouth daily.    [provider]  pravastatin (PRAVACHOL) 10 MG tablet Take 10 mg by mouth at bedtime.    [provider]  ramipril (ALTACE) 10 MG capsule Take 10 mg by mouth 2 (two) times daily.    [provider]    Allergies    Neosporin [bacitracin-polymyxin b]  Review of Systems   Review of Systems  All other systems reviewed and are negative.  Physical Exam Updated Vital Signs BP 127/65    Pulse 63    Temp 97.9 F (36.6 C) (Oral)    Resp 19    Ht 1.727 m (5\' 8" )    Wt 68.9 kg    SpO2  98%    BMI 23.11 kg/m   Physical Exam Vitals and nursing note reviewed.  Constitutional:      General: He is not in acute distress.    Appearance: He is well-developed.  HENT:     Head: Normocephalic and atraumatic.     Right Ear: External ear normal.     Left Ear: External ear normal.  Eyes:     General: No scleral icterus.       Right eye: No discharge.        Left eye: No discharge.     Conjunctiva/sclera: Conjunctivae normal.  Neck:     Trachea: No tracheal deviation.  Cardiovascular:     Rate and Rhythm: Normal rate and regular rhythm.  Pulmonary:     Effort: Pulmonary effort is normal. No respiratory distress.     Breath sounds: Normal breath sounds. No stridor. No wheezing or rales.  Abdominal:     General: Bowel sounds are normal. There is no distension.     Palpations: Abdomen is soft.     Tenderness: There is no abdominal tenderness. There is no  guarding or rebound.  Musculoskeletal:        General: No tenderness or deformity.     Cervical back: Neck supple.  Skin:    General: Skin is warm and dry.     Findings: No rash.  Neurological:     General: No focal deficit present.     Mental Status: He is alert.     Cranial Nerves: No cranial nerve deficit (no facial droop, extraocular movements intact, no slurred speech).     Sensory: No sensory deficit.     Motor: No abnormal muscle tone or seizure activity.     Coordination: Coordination normal.  Psychiatric:        Mood and Affect: Mood normal.    ED Results / Procedures / Treatments   Labs (all labs ordered are listed, but only abnormal results are displayed) Labs Reviewed  CBG MONITORING, ED - Abnormal; Notable for the following components:      Result Value   Glucose-Capillary 59 (*)    All other components within normal limits  CBG MONITORING, ED - Abnormal; Notable for the following components:   Glucose-Capillary 63 (*)    All other components within normal limits  BASIC METABOLIC PANEL  CBC  CBG MONITORING, ED  CBG MONITORING, ED    EKG None  Radiology No results found.  Procedures Procedures   Medications Ordered in ED Medications  dextrose (GLUTOSE) 40 % oral gel 31 g (31 g Oral Given 07/22/21 2130)  dextrose 10 % infusion ( Intravenous Rate/Dose Verify 07/22/21 2330)    ED Course  I have reviewed the triage vital signs and the nursing notes.  Pertinent labs & imaging results that were available during my care of the patient were reviewed by me and considered in my medical decision making (see chart for details).  Clinical Course as of 07/22/21 2334  Wed Jul 22, 2021  2129 Notified that patient's blood glucose is 59.  We will give her a dose of glucose gel. [JK]    Clinical Course User Index [JK] Linwood Dibbles, MD   MDM Rules/Calculators/A&P                         Patient presented to the ED for evaluation of possibly giving himself  excessive insulin.  Patient thinks the pump may have administered 200 units.  Patient did have mild hypoglycemia initially but otherwise asymptomatic.  We will plan on monitoring him closely.  I have started him on a D10 drip while he is here to help prevent any glycemia.  We will plan on monitoring of his blood sugar.  Labs are currently pending.  Care will be turned over to Dr Pilar Plate    Final Clinical Impression(s) / ED Diagnoses Final diagnoses:  Hypoglycemia    Rx / DC Orders ED Discharge Orders     None        Linwood Dibbles, MD 07/22/21 2334

## 2021-07-22 NOTE — ED Triage Notes (Addendum)
[facility]- pt was changing diabetic pump tonight when he accidentally gave himself a 200 bolus of insulin. Pt said BS was 57 and drank 2 cups of juice. When EMS checked BG on truck it was 98. Pt a&o x4.  IV in [facility]

## 2021-07-23 DIAGNOSIS — E162 Hypoglycemia, unspecified: Secondary | ICD-10-CM | POA: Diagnosis not present

## 2021-07-23 DIAGNOSIS — Z794 Long term (current) use of insulin: Secondary | ICD-10-CM | POA: Diagnosis not present

## 2021-07-23 DIAGNOSIS — E860 Dehydration: Secondary | ICD-10-CM | POA: Diagnosis present

## 2021-07-23 DIAGNOSIS — Z20822 Contact with and (suspected) exposure to covid-19: Secondary | ICD-10-CM | POA: Diagnosis present

## 2021-07-23 DIAGNOSIS — Z888 Allergy status to other drugs, medicaments and biological substances status: Secondary | ICD-10-CM | POA: Diagnosis not present

## 2021-07-23 DIAGNOSIS — I6523 Occlusion and stenosis of bilateral carotid arteries: Secondary | ICD-10-CM | POA: Diagnosis not present

## 2021-07-23 DIAGNOSIS — R55 Syncope and collapse: Secondary | ICD-10-CM | POA: Diagnosis present

## 2021-07-23 DIAGNOSIS — I10 Essential (primary) hypertension: Secondary | ICD-10-CM | POA: Diagnosis present

## 2021-07-23 DIAGNOSIS — Z9641 Presence of insulin pump (external) (internal): Secondary | ICD-10-CM | POA: Diagnosis present

## 2021-07-23 DIAGNOSIS — Z7982 Long term (current) use of aspirin: Secondary | ICD-10-CM | POA: Diagnosis not present

## 2021-07-23 DIAGNOSIS — T383X5A Adverse effect of insulin and oral hypoglycemic [antidiabetic] drugs, initial encounter: Secondary | ICD-10-CM | POA: Diagnosis present

## 2021-07-23 DIAGNOSIS — D72829 Elevated white blood cell count, unspecified: Secondary | ICD-10-CM | POA: Diagnosis not present

## 2021-07-23 DIAGNOSIS — E160 Drug-induced hypoglycemia without coma: Secondary | ICD-10-CM | POA: Diagnosis present

## 2021-07-23 DIAGNOSIS — Z79899 Other long term (current) drug therapy: Secondary | ICD-10-CM | POA: Diagnosis not present

## 2021-07-23 DIAGNOSIS — E7800 Pure hypercholesterolemia, unspecified: Secondary | ICD-10-CM | POA: Diagnosis present

## 2021-07-23 LAB — URINALYSIS, ROUTINE W REFLEX MICROSCOPIC
Bilirubin Urine: NEGATIVE
Glucose, UA: 250 mg/dL — AB
Hgb urine dipstick: NEGATIVE
Ketones, ur: NEGATIVE mg/dL
Leukocytes,Ua: NEGATIVE
Nitrite: NEGATIVE
Protein, ur: NEGATIVE mg/dL
Specific Gravity, Urine: <1.005 — ABNORMAL LOW (ref 1.005–1.030)
pH: 6.5 (ref 5.0–8.0)

## 2021-07-23 LAB — COMPREHENSIVE METABOLIC PANEL
ALT: 22 U/L (ref 0–44)
AST: 30 U/L (ref 15–41)
Albumin: 4 g/dL (ref 3.5–5.0)
Alkaline Phosphatase: 58 U/L (ref 38–126)
Anion gap: 8 (ref 5–15)
BUN: 10 mg/dL (ref 8–23)
CO2: 28 mmol/L (ref 22–32)
Calcium: 8.9 mg/dL (ref 8.9–10.3)
Chloride: 90 mmol/L — ABNORMAL LOW (ref 98–111)
Creatinine, Ser: 0.63 mg/dL (ref 0.61–1.24)
GFR, Estimated: >60 mL/min (ref >60)
Glucose, Bld: 109 mg/dL — ABNORMAL HIGH (ref 70–99)
Potassium: 4.8 mmol/L (ref 3.5–5.1)
Sodium: 126 mmol/L — ABNORMAL LOW (ref 135–145)
Total Bilirubin: 0.8 mg/dL (ref 0.3–1.2)
Total Protein: 6.3 g/dL — ABNORMAL LOW (ref 6.5–8.1)

## 2021-07-23 LAB — CBC WITH DIFFERENTIAL/PLATELET
Abs Immature Granulocytes: 0.03 10*3/uL (ref 0.00–0.07)
Basophils Absolute: 0.1 10*3/uL (ref 0.0–0.1)
Basophils Relative: 1 %
Eosinophils Absolute: 0.2 10*3/uL (ref 0.0–0.5)
Eosinophils Relative: 3 %
HCT: 40.8 % (ref 39.0–52.0)
Hemoglobin: 14.2 g/dL (ref 13.0–17.0)
Immature Granulocytes: 0 %
Lymphocytes Relative: 21 %
Lymphs Abs: 1.5 10*3/uL (ref 0.7–4.0)
MCH: 31.2 pg (ref 26.0–34.0)
MCHC: 34.8 g/dL (ref 30.0–36.0)
MCV: 89.7 fL (ref 80.0–100.0)
Monocytes Absolute: 1.1 10*3/uL — ABNORMAL HIGH (ref 0.1–1.0)
Monocytes Relative: 16 %
Neutro Abs: 4.1 10*3/uL (ref 1.7–7.7)
Neutrophils Relative %: 59 %
Platelets: 283 10*3/uL (ref 150–400)
RBC: 4.55 MIL/uL (ref 4.22–5.81)
RDW: 13.4 % (ref 11.5–15.5)
WBC: 7 10*3/uL (ref 4.0–10.5)
nRBC: 0 % (ref 0.0–0.2)

## 2021-07-23 LAB — GLUCOSE, CAPILLARY
Glucose-Capillary: 269 mg/dL — ABNORMAL HIGH (ref 70–99)
Glucose-Capillary: 283 mg/dL — ABNORMAL HIGH (ref 70–99)
Glucose-Capillary: 317 mg/dL — ABNORMAL HIGH (ref 70–99)
Glucose-Capillary: 273 mg/dL — ABNORMAL HIGH (ref 70–99)
Glucose-Capillary: 285 mg/dL — ABNORMAL HIGH (ref 70–99)
Glucose-Capillary: 254 mg/dL — ABNORMAL HIGH (ref 70–99)
Glucose-Capillary: 248 mg/dL — ABNORMAL HIGH (ref 70–99)
Glucose-Capillary: 235 mg/dL — ABNORMAL HIGH (ref 70–99)
Glucose-Capillary: 178 mg/dL — ABNORMAL HIGH (ref 70–99)
Glucose-Capillary: 156 mg/dL — ABNORMAL HIGH (ref 70–99)
Glucose-Capillary: 134 mg/dL — ABNORMAL HIGH (ref 70–99)
Glucose-Capillary: 139 mg/dL — ABNORMAL HIGH (ref 70–99)

## 2021-07-23 LAB — MAGNESIUM: Magnesium: 1.9 mg/dL (ref 1.7–2.4)

## 2021-07-23 LAB — TSH: TSH: 2.68 u[IU]/mL (ref 0.350–4.500)

## 2021-07-23 LAB — BASIC METABOLIC PANEL
Anion gap: 11 (ref 5–15)
BUN: 12 mg/dL (ref 8–23)
CO2: 22 mmol/L (ref 22–32)
Calcium: 8.7 mg/dL — ABNORMAL LOW (ref 8.9–10.3)
Chloride: 86 mmol/L — ABNORMAL LOW (ref 98–111)
Creatinine, Ser: 0.78 mg/dL (ref 0.61–1.24)
GFR, Estimated: >60 mL/min (ref >60)
Glucose, Bld: 220 mg/dL — ABNORMAL HIGH (ref 70–99)
Potassium: 3.9 mmol/L (ref 3.5–5.1)
Sodium: 119 mmol/L — CL (ref 135–145)

## 2021-07-23 LAB — RESP PANEL BY RT-PCR (FLU A&B, COVID) ARPGX2
Influenza A by PCR: NEGATIVE
Influenza B by PCR: NEGATIVE
SARS Coronavirus 2 by RT PCR: NEGATIVE

## 2021-07-23 LAB — CBG MONITORING, ED
Glucose-Capillary: 130 mg/dL — ABNORMAL HIGH (ref 70–99)
Glucose-Capillary: 135 mg/dL — ABNORMAL HIGH (ref 70–99)
Glucose-Capillary: 55 mg/dL — ABNORMAL LOW (ref 70–99)
Glucose-Capillary: 56 mg/dL — ABNORMAL LOW (ref 70–99)
Glucose-Capillary: 92 mg/dL (ref 70–99)
Glucose-Capillary: >600 mg/dL (ref 70–99)

## 2021-07-23 LAB — HEMOGLOBIN A1C
Hgb A1c MFr Bld: 7 % — ABNORMAL HIGH (ref 4.8–5.6)
Mean Plasma Glucose: 154.2 mg/dL

## 2021-07-23 MED ORDER — ASPIRIN EC 81 MG PO TBEC
81.0000 mg | DELAYED_RELEASE_TABLET | Freq: Every day | ORAL | Status: DC
  Administered 2021-07-23 – 2021-07-24 (×2): 81 mg via ORAL
  Filled 2021-07-23 (×2): qty 1

## 2021-07-23 MED ORDER — MORPHINE SULFATE (PF) 2 MG/ML IV SOLN: 1.0000 mg | INTRAVENOUS | Status: DC | PRN

## 2021-07-23 MED ORDER — AMLODIPINE BESYLATE 5 MG PO TABS
5.0000 mg | ORAL_TABLET | Freq: Every day | ORAL | Status: DC
  Administered 2021-07-23 – 2021-07-24 (×2): 5 mg via ORAL
  Filled 2021-07-23 (×2): qty 1

## 2021-07-23 MED ORDER — CHLORHEXIDINE GLUCONATE CLOTH 2 % EX PADS
6.0000 | MEDICATED_PAD | Freq: Every day | CUTANEOUS | Status: DC
  Administered 2021-07-24: 12:00:00 6 via TOPICAL

## 2021-07-23 MED ORDER — ONDANSETRON HCL 4 MG/2ML IJ SOLN
4.0000 mg | Freq: Once | INTRAMUSCULAR | Status: AC
  Administered 2021-07-23: 05:00:00 4 mg via INTRAVENOUS

## 2021-07-23 MED ORDER — ACETAMINOPHEN 650 MG RE SUPP: 650.0000 mg | Freq: Four times a day (QID) | RECTAL | Status: DC | PRN

## 2021-07-23 MED ORDER — INSULIN ASPART 100 UNIT/ML IJ SOLN
5.0000 [IU] | Freq: Three times a day (TID) | INTRAMUSCULAR | Status: AC
  Administered 2021-07-23 – 2021-07-24 (×4): 5 [IU] via SUBCUTANEOUS

## 2021-07-23 MED ORDER — HEPARIN SODIUM (PORCINE) 5000 UNIT/ML IJ SOLN
5000.0000 [IU] | Freq: Three times a day (TID) | INTRAMUSCULAR | Status: DC
  Administered 2021-07-23 – 2021-07-24 (×4): 5000 [IU] via SUBCUTANEOUS
  Filled 2021-07-23 (×4): qty 1

## 2021-07-23 MED ORDER — INSULIN ASPART 100 UNIT/ML IJ SOLN
0.0000 [IU] | Freq: Three times a day (TID) | INTRAMUSCULAR | Status: DC
  Administered 2021-07-23 (×2): 3 [IU] via SUBCUTANEOUS

## 2021-07-23 MED ORDER — DEXTROSE 50 % IV SOLN
1.0000 | Freq: Once | INTRAVENOUS | Status: AC
  Administered 2021-07-23: 02:00:00 50 mL via INTRAVENOUS
  Filled 2021-07-23: qty 50

## 2021-07-23 MED ORDER — "INSULIN SYRINGE 30G X 5/16"" 0.3 ML MISC": 1 refills | Status: AC

## 2021-07-23 MED ORDER — SODIUM CHLORIDE 0.9 % IV SOLN: INTRAVENOUS | Status: DC

## 2021-07-23 MED ORDER — MORPHINE SULFATE (PF) 2 MG/ML IV SOLN: 2.0000 mg | INTRAVENOUS | Status: DC | PRN

## 2021-07-23 MED ORDER — LATANOPROST 0.005 % OP SOLN
1.0000 [drp] | Freq: Every day | OPHTHALMIC | Status: DC
  Administered 2021-07-23: 22:00:00 1 [drp] via OPHTHALMIC
  Filled 2021-07-23 (×2): qty 2.5

## 2021-07-23 MED ORDER — SODIUM CHLORIDE 0.9 % IV BOLUS
1000.0000 mL | Freq: Once | INTRAVENOUS | Status: AC
  Administered 2021-07-23: 17:00:00 1000 mL via INTRAVENOUS

## 2021-07-23 MED ORDER — INSULIN ASPART 100 UNIT/ML IJ SOLN
0.0000 [IU] | Freq: Three times a day (TID) | INTRAMUSCULAR | Status: AC
Start: 2021-07-23 — End: 2021-07-24
  Administered 2021-07-23: 19:00:00 3 [IU] via SUBCUTANEOUS
  Administered 2021-07-24: 08:00:00 5 [IU] via SUBCUTANEOUS

## 2021-07-23 MED ORDER — INSULIN GLARGINE-YFGN 100 UNIT/ML ~~LOC~~ SOLN
15.0000 [IU] | Freq: Every day | SUBCUTANEOUS | Status: DC
Start: 2021-07-23 — End: 2021-07-24
  Administered 2021-07-23: 11:00:00 15 [IU] via SUBCUTANEOUS
  Filled 2021-07-23 (×2): qty 0.15

## 2021-07-23 MED ORDER — PRAVASTATIN SODIUM 10 MG PO TABS
10.0000 mg | ORAL_TABLET | Freq: Every day | ORAL | Status: DC
  Administered 2021-07-23: 21:00:00 10 mg via ORAL
  Filled 2021-07-23: qty 1

## 2021-07-23 MED ORDER — OXYCODONE HCL 5 MG PO TABS: 5.0000 mg | ORAL_TABLET | ORAL | Status: DC | PRN

## 2021-07-23 MED ORDER — ONDANSETRON HCL 4 MG PO TABS: 4.0000 mg | ORAL_TABLET | Freq: Four times a day (QID) | ORAL | Status: DC | PRN

## 2021-07-23 MED ORDER — ONDANSETRON HCL 4 MG/2ML IJ SOLN
4.0000 mg | Freq: Four times a day (QID) | INTRAMUSCULAR | Status: DC | PRN
  Administered 2021-07-23 (×2): 4 mg via INTRAVENOUS
  Filled 2021-07-23 (×2): qty 2

## 2021-07-23 MED ORDER — LISINOPRIL 20 MG PO TABS
20.0000 mg | ORAL_TABLET | Freq: Every day | ORAL | Status: AC
Start: 2021-07-24 — End: ?

## 2021-07-23 MED ORDER — DEXTROSE 50 % IV SOLN: 1.0000 | Freq: Once | INTRAVENOUS | Status: AC

## 2021-07-23 MED ORDER — DEXTROSE 50 % IV SOLN
INTRAVENOUS | Status: AC
  Filled 2021-07-23: qty 50

## 2021-07-23 MED ORDER — ACETAMINOPHEN 325 MG PO TABS: 650.0000 mg | ORAL_TABLET | Freq: Four times a day (QID) | ORAL | Status: DC | PRN

## 2021-07-23 MED ORDER — ACETAMINOPHEN 500 MG PO TABS
1000.0000 mg | ORAL_TABLET | Freq: Once | ORAL | Status: AC
  Administered 2021-07-23: 01:00:00 1000 mg via ORAL
  Filled 2021-07-23: qty 2

## 2021-07-23 MED ORDER — DEXTROSE 50 % IV SOLN
INTRAVENOUS | Status: AC
  Administered 2021-07-23: 01:00:00 50 mL via INTRAVENOUS
  Filled 2021-07-23: qty 50

## 2021-07-23 MED ORDER — DEXTROSE 10 % IV SOLN: INTRAVENOUS | Status: DC

## 2021-07-23 MED ORDER — INSULIN ASPART 100 UNIT/ML IJ SOLN: 0.0000 [IU] | Freq: Every day | INTRAMUSCULAR | Status: DC

## 2021-07-23 MED ORDER — DEXTROSE 50 % IV SOLN: 1.0000 | Freq: Once | INTRAVENOUS | Status: DC

## 2021-07-23 MED ORDER — DEXTROSE 50 % IV SOLN
50.0000 mL | Freq: Once | INTRAVENOUS | Status: AC
  Administered 2021-07-23: 05:00:00 50 mL via INTRAVENOUS

## 2021-07-23 MED ORDER — RAMIPRIL 5 MG PO CAPS
10.0000 mg | ORAL_CAPSULE | Freq: Two times a day (BID) | ORAL | Status: DC
  Administered 2021-07-23: 09:00:00 10 mg via ORAL
  Filled 2021-07-23 (×4): qty 2

## 2021-07-23 NOTE — Progress Notes (Signed)
°  Transition of Care (TOC) Screening Note   Patient Details  Name: Scott Garner Date of Birth: 1945/11/29   Transition [facility]) CM/SW Contact:    Elliot Gault, LCSW Phone Number: 07/23/2021, 11:16 AM    Transition of Care Department [facility]) has reviewed patient and no TOC needs have been identified at this time. Will continue to monitor patient advancement through interdisciplinary progression rounds. If new patient transition needs arise, please place a TOC consult.

## 2021-07-23 NOTE — H&P (Signed)
TRH H&P    Patient Demographics:    Scott Garner, is a 75 y.o. male  MRN: [id number]  DOB - 03-Feb-1946  Admit Date - 07/22/2021  Referring MD/NP/PA: Pilar Plate  Outpatient Primary MD for the patient is Assunta Found, MD  Patient coming from: Home  Chief complaint- Hypoglycemia   HPI:    Scott Garner  is a 75 y.o. male, with history of type 1 diabetes mellitus, hyperlipidemia, hypertension, presents ED with a chief complaint of hypoglycemia.  Wife reports that patient wears the libre freestyle.  At 5:30 PM they were getting warnings that it was time to change upon.  Wife reports a change in pump at 530, and then around 7 PM she started having alarm going off.  She noticed that all of the insulin from the pump was gone.  The approximate it was 200 units.  They are not sure what is his normal basal rate is but wife reports she is going to find out.  He gave him a cup of milk, and 2 Welches juices.  She cannot get his glucose to come up over 50, so she called EMS.  EMS then gave him D50 in route.  Highest glucose since the episode started was 117 per wife.  They have never had anything like this happen before.  Patient does occasionally have lows but not like this.  The difference is this is a longer duration.  Patient reports that he knew the hypoglycemia was affecting him but he cannot characterize the symptoms.  He reports that he was not shaky, diaphoretic, flushed, or near syncopal.  With that being said he did have a syncopal episode in the ED while in route to the ICU.  Please see nursing note.  Patient does report nausea once he is here in the hospital.  He said no chest pain.  Otherwise he has been in his normal state of health.  Wife reports patient has been on the insulin pump for 12 years.  He was diagnosed with type 1 diabetes when he was 25.  His sugars have been controlled  Patient does not smoke, does not  drink, does not use illicit drugs.  Is vaccinated for COVID. Code.  In the ED vital signs stable, hematology stable, chemistry unremarkable aside from hyporglycemia.  Admission requested for observation in the setting of high hypoglycemia    Review of systems:    In addition to the HPI above,  No Fever-chills, No Headache, No changes with Vision or hearing, No problems swallowing food or Liquids, No Chest pain, Cough or Shortness of Breath, No Abdominal pain, No Nausea or Vomiting, bowel movements are regular, No Blood in stool or Urine, No dysuria, No new skin rashes or bruises, No new joints pains-aches,  No new weakness, tingling, numbness in any extremity, No recent weight gain or loss, No polyuria, polydypsia or polyphagia, No significant Mental Stressors.  All other systems reviewed and are negative.    Past History of the following :    Past Medical History:  Diagnosis Date  Diabetes mellitus without complication (HCC)    Hypercholesteremia    Hypertension       Past Surgical History:  Procedure Laterality Date   COLONOSCOPY     COLONOSCOPY N/A 10/24/2012   Procedure: COLONOSCOPY;  Surgeon: Dalia Heading, MD;  Location: [facility];  Service: Gastroenterology;  Laterality: N/A;   HERNIA REPAIR     NASAL SINUS SURGERY        Social History:      Social History   Tobacco Use   Smoking status: Never   Smokeless tobacco: Not on file  Substance Use Topics   Alcohol use: No       Family History :     Family History  Problem Relation Age of Onset   Colon cancer Brother       Home Medications:   Prior to Admission medications   Medication Sig Start Date End Date Taking? Authorizing Provider  amLODipine (NORVASC) 5 MG tablet Take 5 mg by mouth daily.    [provider]  aspirin EC 81 MG tablet Take 81 mg by mouth daily.    [provider]  COD LIVER OIL PO Take 1 capsule by mouth daily.    [provider]   diclofenac sodium (VOLTAREN) 1 % GEL Apply 2 g topically 2 (two) times daily. Applies to Neck Area.    [provider]  fish oil-omega-3 fatty acids 1000 MG capsule Take 1 g by mouth daily.    [provider]  insulin aspart (NOVOLOG) 100 UNIT/ML injection Inject into the skin continuous. Patient has an insulin pump.    [provider]  Multiple Vitamin (MULTIVITAMIN WITH MINERALS) TABS Take 1 tablet by mouth daily.    [provider]  pravastatin (PRAVACHOL) 10 MG tablet Take 10 mg by mouth at bedtime.    [provider]  ramipril (ALTACE) 10 MG capsule Take 10 mg by mouth 2 (two) times daily.    [provider]     Allergies:     Allergies  Allergen Reactions   Neosporin [Bacitracin-Polymyxin B]     hives     Physical Exam:   Vitals  Blood pressure (!) 170/70, pulse 67, temperature (!) 97.4 F (36.3 C), temperature source Oral, resp. rate 15, height  (1.727 m), weight 70.5 kg, SpO2 94 %.   1.  General: Patient lying supine in bed,  no acute distress   2. Psychiatric: Alert and oriented x 3, mood and behavior normal for situation, pleasant and cooperative with exam   3. Neurologic: Speech and language are normal, face is symmetric, moves all 4 extremities voluntarily, at baseline without acute deficits on limited exam   4. HEENMT:  Head is atraumatic, normocephalic, pupils reactive to light, neck is supple, trachea is midline, mucous membranes are moist   5. Respiratory : Lungs are clear to auscultation bilaterally without wheezing, rhonchi, rales, no cyanosis, no increase in work of breathing or accessory muscle use   6. Cardiovascular : Heart rate normal, rhythm is regular, no murmurs, rubs or gallops, no peripheral edema, peripheral pulses palpated   7. Gastrointestinal:  Abdomen is soft, nondistended, nontender to palpation bowel sounds active, no masses or organomegaly palpated   8. Skin:  Skin is warm,  dry and intact without rashes, acute lesions, or ulcers on limited exam   9.Musculoskeletal:  No acute deformities or trauma, no asymmetry in tone, no peripheral edema, peripheral pulses palpated, no tenderness to palpation in the  extremities     Data Review:    CBC Recent Labs  Lab 07/22/21 2321 07/23/21 0347  WBC 7.6 7.0  HGB 13.9 14.2  HCT 39.8 40.8  PLT 267 283  MCV 89.4 89.7  MCH 31.2 31.2  MCHC 34.9 34.8  RDW 13.4 13.4  LYMPHSABS  --  1.5  MONOABS  --  1.1*  EOSABS  --  0.2  BASOSABS  --  0.1   ------------------------------------------------------------------------------------------------------------------  Results for orders placed or performed during the hospital encounter of 07/22/21 (from the past 48 hour(s))  CBG monitoring, ED     Status: Abnormal   Collection Time: 07/22/21  9:28 PM  Result Value Ref Range   Glucose-Capillary 59 (L) 70 - 99 mg/dL    Comment: Glucose reference range applies only to samples taken after fasting for at least 8 hours.  CBG monitoring, ED (now and then every hour for 3 hours)     Status: Abnormal   Collection Time: 07/22/21  9:49 PM  Result Value Ref Range   Glucose-Capillary 63 (L) 70 - 99 mg/dL    Comment: Glucose reference range applies only to samples taken after fasting for at least 8 hours.  CBG monitoring, ED (now and then every hour for 3 hours)     Status: None   Collection Time: 07/22/21 10:32 PM  Result Value Ref Range   Glucose-Capillary 87 70 - 99 mg/dL    Comment: Glucose reference range applies only to samples taken after fasting for at least 8 hours.  Basic metabolic panel     Status: Abnormal   Collection Time: 07/22/21 11:21 PM  Result Value Ref Range   Sodium 126 (L) 135 - 145 mmol/L   Potassium 3.5 3.5 - 5.1 mmol/L   Chloride 93 (L) 98 - 111 mmol/L   CO2 25 22 - 32 mmol/L   Glucose, Bld 53 (L) 70 - 99 mg/dL    Comment: Glucose reference range applies only to samples taken after fasting for at least 8  hours.   BUN 12 8 - 23 mg/dL   Creatinine, Ser 3.00 0.61 - 1.24 mg/dL   Calcium 9.1 8.9 - 92.3 mg/dL   GFR, Estimated >30 >07 mL/min    Comment: (NOTE) Calculated using the CKD-EPI Creatinine Equation (2021)    Anion gap 8 5 - 15    Comment: Performed at [facility], [address] [place], [place] [address]  CBC     Status: None   Collection Time: 07/22/21 11:21 PM  Result Value Ref Range   WBC 7.6 4.0 - 10.5 K/uL   RBC 4.45 4.22 - 5.81 MIL/uL   Hemoglobin 13.9 13.0 - 17.0 g/dL   HCT 33.5 45.6 - 25.6 %   MCV 89.4 80.0 - 100.0 fL   MCH 31.2 26.0 - 34.0 pg   MCHC 34.9 30.0 - 36.0 g/dL   RDW 38.9 37.3 - 42.8 %   Platelets 267 150 - 400 K/uL   nRBC 0.0 0.0 - 0.2 %    Comment: Performed at [facility], [address], [place], [place] [address]  CBG monitoring, ED (now and then every hour for 3 hours)     Status: None   Collection Time: 07/22/21 11:24 PM  Result Value Ref Range   Glucose-Capillary 80 70 - 99 mg/dL    Comment: Glucose reference range applies only to samples taken after fasting for at least 8 hours.  CBG monitoring, ED     Status: Abnormal  Collection Time: 07/23/21 12:30 AM  Result Value Ref Range   Glucose-Capillary 55 (L) 70 - 99 mg/dL    Comment: Glucose reference range applies only to samples taken after fasting for at least 8 hours.  CBG monitoring, ED     Status: None   Collection Time: 07/23/21  1:09 AM  Result Value Ref Range   Glucose-Capillary 92 70 - 99 mg/dL    Comment: Glucose reference range applies only to samples taken after fasting for at least 8 hours.  Resp Panel by RT-PCR (Flu A&B, Covid) Nasopharyngeal Swab     Status: None   Collection Time: 07/23/21  2:10 AM   Specimen: Nasopharyngeal Swab; Nasopharyngeal(NP) swabs in vial transport medium  Result Value Ref Range   SARS Coronavirus 2 by RT PCR NEGATIVE NEGATIVE    Comment: (NOTE) SARS-CoV-2 target nucleic acids are NOT DETECTED.  The SARS-CoV-2 RNA is generally detectable in upper  respiratory specimens during the acute phase of infection. The lowest concentration of SARS-CoV-2 viral copies this assay can detect is 138 copies/mL. A negative result does not preclude SARS-Cov-2 infection and should not be used as the sole basis for treatment or other patient management decisions. A negative result may occur with  improper specimen collection/handling, submission of specimen other than nasopharyngeal swab, presence of viral mutation(s) within the areas targeted by this assay, and inadequate number of viral copies(<138 copies/mL). A negative result must be combined with clinical observations, patient history, and epidemiological information. The expected result is Negative.  Fact Sheet for Patients:  BloggerCourse.com  Fact Sheet for Healthcare Providers:  SeriousBroker.it  This test is no t yet approved or cleared by the [place] FDA and  has been authorized for detection and/or diagnosis of SARS-CoV-2 by FDA under an Emergency Use Authorization (EUA). This EUA will remain  in effect (meaning this test can be used) for the duration of the COVID-19 declaration under Section 564(b)(1) of the Act, 21 U.S.C.section 360bbb-3(b)(1), unless the authorization is terminated  or revoked sooner.       Influenza A by PCR NEGATIVE NEGATIVE   Influenza B by PCR NEGATIVE NEGATIVE    Comment: (NOTE) The Xpert Xpress SARS-CoV-2/FLU/RSV plus assay is intended as an aid in the diagnosis of influenza from Nasopharyngeal swab specimens and should not be used as a sole basis for treatment. Nasal washings and aspirates are unacceptable for Xpert Xpress SARS-CoV-2/FLU/RSV testing.  Fact Sheet for Patients: BloggerCourse.com  Fact Sheet for Healthcare Providers: SeriousBroker.it  This test is not yet approved or cleared by the [place] FDA and has been authorized for  detection and/or diagnosis of SARS-CoV-2 by FDA under an Emergency Use Authorization (EUA). This EUA will remain in effect (meaning this test can be used) for the duration of the COVID-19 declaration under Section 564(b)(1) of the Act, 21 U.S.C. section 360bbb-3(b)(1), unless the authorization is terminated or revoked.  Performed at [facility], [address], [place], [place] [address]   CBG monitoring, ED     Status: Abnormal   Collection Time: 07/23/21  2:11 AM  Result Value Ref Range   Glucose-Capillary 56 (L) 70 - 99 mg/dL    Comment: Glucose reference range applies only to samples taken after fasting for at least 8 hours.  CBG monitoring, ED     Status: Abnormal   Collection Time: 07/23/21  2:58 AM  Result Value Ref Range   Glucose-Capillary 135 (H) 70 - 99 mg/dL    Comment: Glucose reference range applies  only to samples taken after fasting for at least 8 hours.  Comprehensive metabolic panel     Status: Abnormal   Collection Time: 07/23/21  3:47 AM  Result Value Ref Range   Sodium 126 (L) 135 - 145 mmol/L   Potassium 4.8 3.5 - 5.1 mmol/L    Comment: DELTA CHECK NOTED   Chloride 90 (L) 98 - 111 mmol/L   CO2 28 22 - 32 mmol/L   Glucose, Bld 109 (H) 70 - 99 mg/dL    Comment: Glucose reference range applies only to samples taken after fasting for at least 8 hours.   BUN 10 8 - 23 mg/dL   Creatinine, Ser 5.78 0.61 - 1.24 mg/dL   Calcium 8.9 8.9 - 46.9 mg/dL   Total Protein 6.3 (L) 6.5 - 8.1 g/dL   Albumin 4.0 3.5 - 5.0 g/dL   AST 30 15 - 41 U/L   ALT 22 0 - 44 U/L   Alkaline Phosphatase 58 38 - 126 U/L   Total Bilirubin 0.8 0.3 - 1.2 mg/dL   GFR, Estimated >62 >95 mL/min    Comment: (NOTE) Calculated using the CKD-EPI Creatinine Equation (2021)    Anion gap 8 5 - 15    Comment: Performed at [facility], [address] [place], [place] [address]  Magnesium     Status: None   Collection Time: 07/23/21  3:47 AM  Result Value Ref Range   Magnesium 1.9 1.7 - 2.4 mg/dL     Comment: Performed at [facility], [address] [place], [place] [address]  CBC WITH DIFFERENTIAL     Status: Abnormal   Collection Time: 07/23/21  3:47 AM  Result Value Ref Range   WBC 7.0 4.0 - 10.5 K/uL   RBC 4.55 4.22 - 5.81 MIL/uL   Hemoglobin 14.2 13.0 - 17.0 g/dL   HCT 02.7 25.3 - 66.4 %   MCV 89.7 80.0 - 100.0 fL   MCH 31.2 26.0 - 34.0 pg   MCHC 34.8 30.0 - 36.0 g/dL   RDW 40.3 47.4 - 25.9 %   Platelets 283 150 - 400 K/uL   nRBC 0.0 0.0 - 0.2 %   Neutrophils Relative % 59 %   Neutro Abs 4.1 1.7 - 7.7 K/uL   Lymphocytes Relative 21 %   Lymphs Abs 1.5 0.7 - 4.0 K/uL   Monocytes Relative 16 %   Monocytes Absolute 1.1 (H) 0.1 - 1.0 K/uL   Eosinophils Relative 3 %   Eosinophils Absolute 0.2 0.0 - 0.5 K/uL   Basophils Relative 1 %   Basophils Absolute 0.1 0.0 - 0.1 K/uL   Immature Granulocytes 0 %   Abs Immature Granulocytes 0.03 0.00 - 0.07 K/uL    Comment: Performed at [facility], [address], [place], [place] [address]  TSH     Status: None   Collection Time: 07/23/21  3:47 AM  Result Value Ref Range   TSH 2.680 0.350 - 4.500 uIU/mL    Comment: Performed by a 3rd Generation assay with a functional sensitivity of <=0.01 uIU/mL. Performed at [facility], [address], [place], [place] [address]   CBG monitoring, ED     Status: Abnormal   Collection Time: 07/23/21  4:18 AM  Result Value Ref Range   Glucose-Capillary 130 (H) 70 - 99 mg/dL    Comment: Glucose reference range applies only to samples taken after fasting for at least 8 hours.  Urinalysis, Routine w reflex microscopic Urine, Clean Catch  Status: Abnormal   Collection Time: 07/23/21  4:56 AM  Result Value Ref Range   Color, Urine YELLOW YELLOW   APPearance CLEAR CLEAR   Specific Gravity, Urine <1.005 (L) 1.005 - 1.030   pH 6.5 5.0 - 8.0   Glucose, UA 250 (A) NEGATIVE mg/dL   Hgb urine dipstick NEGATIVE NEGATIVE   Bilirubin Urine NEGATIVE NEGATIVE   Ketones, ur NEGATIVE NEGATIVE mg/dL    Protein, ur NEGATIVE NEGATIVE mg/dL   Nitrite NEGATIVE NEGATIVE   Leukocytes,Ua NEGATIVE NEGATIVE    Comment: Microscopic not done on urines with negative protein, blood, leukocytes, nitrite, or glucose < 500 mg/dL. Performed at [facility], [address] [place], [place] [address]   CBG monitoring, ED     Status: Abnormal   Collection Time: 07/23/21  5:08 AM  Result Value Ref Range   Glucose-Capillary >600 (HH) 70 - 99 mg/dL    Comment: Glucose reference range applies only to samples taken after fasting for at least 8 hours.  Glucose, capillary     Status: Abnormal   Collection Time: 07/23/21  5:45 AM  Result Value Ref Range   Glucose-Capillary 283 (H) 70 - 99 mg/dL    Comment: Glucose reference range applies only to samples taken after fasting for at least 8 hours.  Glucose, capillary     Status: Abnormal   Collection Time: 07/23/21  6:04 AM  Result Value Ref Range   Glucose-Capillary 269 (H) 70 - 99 mg/dL    Comment: Glucose reference range applies only to samples taken after fasting for at least 8 hours.  Glucose, capillary     Status: Abnormal   Collection Time: 07/23/21  6:27 AM  Result Value Ref Range   Glucose-Capillary 317 (H) 70 - 99 mg/dL    Comment: Glucose reference range applies only to samples taken after fasting for at least 8 hours.    Chemistries  Recent Labs  Lab 07/22/21 2321 07/23/21 0347  NA 126* 126*  K 3.5 4.8  CL 93* 90*  CO2 25 28  GLUCOSE 53* 109*  BUN 12 10  CREATININE 0.67 0.63  CALCIUM 9.1 8.9  MG  --  1.9  AST  --  30  ALT  --  22  ALKPHOS  --  58  BILITOT  --  0.8   ------------------------------------------------------------------------------------------------------------------  ------------------------------------------------------------------------------------------------------------------ GFR: Estimated Creatinine Clearance: 77.2 mL/min (by C-G formula based on SCr of 0.63 mg/dL). Liver Function Tests: Recent Labs  Lab  07/23/21 0347  AST 30  ALT 22  ALKPHOS 58  BILITOT 0.8  PROT 6.3*  ALBUMIN 4.0   No results for input(s): LIPASE, AMYLASE in the last 168 hours. No results for input(s): AMMONIA in the last 168 hours. Coagulation Profile: No results for input(s): INR, PROTIME in the last 168 hours. Cardiac Enzymes: No results for input(s): CKTOTAL, CKMB, CKMBINDEX, TROPONINI in the last 168 hours. BNP (last 3 results) No results for input(s): PROBNP in the last 8760 hours. HbA1C: No results for input(s): HGBA1C in the last 72 hours. CBG: Recent Labs  Lab 07/23/21 0418 07/23/21 0508 07/23/21 0545 07/23/21 0604 07/23/21 0627  GLUCAP 130* >600* 283* 269* 317*   Lipid Profile: No results for input(s): CHOL, HDL, LDLCALC, TRIG, CHOLHDL, LDLDIRECT in the last 72 hours. Thyroid Function Tests: Recent Labs    07/23/21 0347  TSH 2.680   Anemia Panel: No results for input(s): VITAMINB12, FOLATE, FERRITIN, TIBC, IRON, RETICCTPCT in the last 72 hours.  --------------------------------------------------------------------------------------------------------------- Urine analysis:  Component Value Date/Time   COLORURINE YELLOW 07/23/2021 0456   APPEARANCEUR CLEAR 07/23/2021 0456   LABSPEC <1.005 (L) 07/23/2021 0456   PHURINE 6.5 07/23/2021 0456   GLUCOSEU 250 (A) 07/23/2021 0456   HGBUR NEGATIVE 07/23/2021 0456   BILIRUBINUR NEGATIVE 07/23/2021 0456   KETONESUR NEGATIVE 07/23/2021 0456   PROTEINUR NEGATIVE 07/23/2021 0456   UROBILINOGEN 0.2 07/14/2009 1859   NITRITE NEGATIVE 07/23/2021 0456   LEUKOCYTESUR NEGATIVE 07/23/2021 0456      Imaging Results:    No results found.    Assessment & Plan:    Principal Problem:   Hypoglycemia   Hypoglycemia in the setting of type 1 diabetes Holding insulin Continue D10 Admit to ICU, every hour CBG checks Patient is slowly recovering with high his sugar and 17 No AKI or other metabolic process identified that could be affecting  metabolism of insulin Continue to monitor Hypertension Continue to amlodipine and ramipril Hyperlipidemia Continue statin    DVT Prophylaxis-   Heparin - SCDs   AM Labs Ordered, also please review Full Orders  Family Communication: Admission, patients condition and plan of care including tests being ordered have been discussed with the patient and wife who indicate understanding and agree with the plan and Code Status.  Code Status: Full  Admission status:Inpatient :The appropriate admission status for this patient is INPATIENT. Inpatient status is judged to be reasonable and necessary in order to provide the required intensity of service to ensure the patient's safety. The patient's presenting symptoms, physical exam findings, and initial radiographic and laboratory data in the context of their chronic comorbidities is felt to place them at high risk for further clinical deterioration. Furthermore, it is not anticipated that the patient will be medically stable for discharge from the hospital within 2 midnights of admission. The following factors support the admission status of inpatient.     The patient's presenting symptoms include hypoglycemia. The worrisome physical exam findings include near syncopal episode. The chronic co-morbidities include type 1 diabetes, hypertension, hyperlipidemia.       * I certify that at the point of admission it is my clinical judgment that the patient will require inpatient hospital care spanning beyond 2 midnights from the point of admission due to high intensity of service, high risk for further deterioration and high frequency of surveillance required.*  Disposition: Anticipated Discharge date 48 hours discharge to home  Time spent in minutes : 65   Aloys Hupfer B Zierle-Ghosh DO

## 2021-07-23 NOTE — Progress Notes (Addendum)
Inpatient Diabetes Program Recommendations  AACE/ADA: New Consensus Statement on Inpatient Glycemic Control (2015)  Target Ranges:  Prepandial:   less than 140 mg/dL      Peak postprandial:   less than 180 mg/dL (1-2 hours)      Critically ill patients:  140 - 180 mg/dL   Lab Results  Component Value Date   GLUCAP 254 (H) 07/23/2021   HGBA1C 7.0 (H) 07/23/2021    Review of Glycemic Control  Diabetes history: type 1 Outpatient Diabetes medications: insulin pump  Current orders for Inpatient glycemic control: Semglee 15 units daily, Novolog SENSITIVE correction scale TID, Novolog 5 units TID  Inpatient Diabetes Program Recommendations:   Spoke with patient's wife on the phone. Patient was sleeping. Inquired about patient using his insulin pump at home. She feels that he received too much insulin which caused the hypoglycemia. Reminded wife that he had long acting insulin this morning and will need to wait to start insulin pump back in the morning. She states that she has spoken with the pump company and Dr. Willeen Cass office. The person who works with the pumps is supposed to call her back in regards to his pump.   She seems to be comfortable with the situation and feels that the hypoglycemia is resolved. Encouraged her to have him check blood sugars frequently at home and to be in touch with the doctor if needed. She seemed to understand.  Smith Mince RN BSN CDE Diabetes Coordinator Pager: [phone]  8am-5pm

## 2021-07-23 NOTE — Discharge Instructions (Signed)

## 2021-07-23 NOTE — ED Notes (Addendum)
This RN was taking pt up in wheelchair when this RN heard pt making gurgling sounds. RN assessed pt and pt was throwing up, unresponsive, pale with lips purple. RN rushed back to ED where RN and staff placed pt on bed. EDP at bedside and pt started to become more responsive. Pt given amp of D50 and Zofran. Pt was very nauseous and diaphoretic. After a few minutes pt color had returned and was answering questions appropriately. Vital signs WNL and hospitalist made way down to assess pt. Given the ok by the hospitalist to take pt upstairs, this RN and paramedic took pt to ICU.

## 2021-07-23 NOTE — Progress Notes (Signed)
ASSUMPTION OF CARE NOTE   07/23/2021 6:24 PM  Scott Garner was seen and examined.  The H&P by the admitting provider, orders, imaging was reviewed.  Please see new orders.  Will continue to follow.    Pt having near syncopal episodes everytime we try to stand him up.  I suspect he is has orthostatic hypotensive, will check orthostatic vitals, bolus IV fluids and check BMP to rule out DKA.  IV fluid ordered. Will check echo, carotid dopplers and further work up in stepdown ICU.  He should be able to resume insulin pump in AM.  He has lantus on board and novolog prandial and SSI coverage ordered.    Vitals:   07/23/21 1700 07/23/21 1800  BP: (!) 149/51 (!) 86/35  Pulse: 91 86  Resp: 18 18  Temp:    SpO2: 93% 91%    Results for orders placed or performed during the hospital encounter of 07/22/21  Resp Panel by RT-PCR (Flu A&B, Covid) Nasopharyngeal Swab   Specimen: Nasopharyngeal Swab; Nasopharyngeal(NP) swabs in vial transport medium  Result Value Ref Range   SARS Coronavirus 2 by RT PCR NEGATIVE NEGATIVE   Influenza A by PCR NEGATIVE NEGATIVE   Influenza B by PCR NEGATIVE NEGATIVE  Basic metabolic panel  Result Value Ref Range   Sodium 126 (L) 135 - 145 mmol/L   Potassium 3.5 3.5 - 5.1 mmol/L   Chloride 93 (L) 98 - 111 mmol/L   CO2 25 22 - 32 mmol/L   Glucose, Bld 53 (L) 70 - 99 mg/dL   BUN 12 8 - 23 mg/dL   Creatinine, Ser 7.25 0.61 - 1.24 mg/dL   Calcium 9.1 8.9 - 36.6 mg/dL   GFR, Estimated >44 >03 mL/min   Anion gap 8 5 - 15  CBC  Result Value Ref Range   WBC 7.6 4.0 - 10.5 K/uL   RBC 4.45 4.22 - 5.81 MIL/uL   Hemoglobin 13.9 13.0 - 17.0 g/dL   HCT 47.4 25.9 - 56.3 %   MCV 89.4 80.0 - 100.0 fL   MCH 31.2 26.0 - 34.0 pg   MCHC 34.9 30.0 - 36.0 g/dL   RDW 87.5 64.3 - 32.9 %   Platelets 267 150 - 400 K/uL   nRBC 0.0 0.0 - 0.2 %  Comprehensive metabolic panel  Result Value Ref Range   Sodium 126 (L) 135 - 145 mmol/L   Potassium 4.8 3.5 - 5.1 mmol/L   Chloride 90  (L) 98 - 111 mmol/L   CO2 28 22 - 32 mmol/L   Glucose, Bld 109 (H) 70 - 99 mg/dL   BUN 10 8 - 23 mg/dL   Creatinine, Ser 5.18 0.61 - 1.24 mg/dL   Calcium 8.9 8.9 - 84.1 mg/dL   Total Protein 6.3 (L) 6.5 - 8.1 g/dL   Albumin 4.0 3.5 - 5.0 g/dL   AST 30 15 - 41 U/L   ALT 22 0 - 44 U/L   Alkaline Phosphatase 58 38 - 126 U/L   Total Bilirubin 0.8 0.3 - 1.2 mg/dL   GFR, Estimated >66 >06 mL/min   Anion gap 8 5 - 15  Magnesium  Result Value Ref Range   Magnesium 1.9 1.7 - 2.4 mg/dL  CBC WITH DIFFERENTIAL  Result Value Ref Range   WBC 7.0 4.0 - 10.5 K/uL   RBC 4.55 4.22 - 5.81 MIL/uL   Hemoglobin 14.2 13.0 - 17.0 g/dL   HCT 30.1 60.1 - 09.3 %   MCV 89.7  80.0 - 100.0 fL   MCH 31.2 26.0 - 34.0 pg   MCHC 34.8 30.0 - 36.0 g/dL   RDW 89.3 81.0 - 17.5 %   Platelets 283 150 - 400 K/uL   nRBC 0.0 0.0 - 0.2 %   Neutrophils Relative % 59 %   Neutro Abs 4.1 1.7 - 7.7 K/uL   Lymphocytes Relative 21 %   Lymphs Abs 1.5 0.7 - 4.0 K/uL   Monocytes Relative 16 %   Monocytes Absolute 1.1 (H) 0.1 - 1.0 K/uL   Eosinophils Relative 3 %   Eosinophils Absolute 0.2 0.0 - 0.5 K/uL   Basophils Relative 1 %   Basophils Absolute 0.1 0.0 - 0.1 K/uL   Immature Granulocytes 0 %   Abs Immature Granulocytes 0.03 0.00 - 0.07 K/uL  TSH  Result Value Ref Range   TSH 2.680 0.350 - 4.500 uIU/mL  Urinalysis, Routine w reflex microscopic Urine, Clean Catch  Result Value Ref Range   Color, Urine YELLOW YELLOW   APPearance CLEAR CLEAR   Specific Gravity, Urine <1.005 (L) 1.005 - 1.030   pH 6.5 5.0 - 8.0   Glucose, UA 250 (A) NEGATIVE mg/dL   Hgb urine dipstick NEGATIVE NEGATIVE   Bilirubin Urine NEGATIVE NEGATIVE   Ketones, ur NEGATIVE NEGATIVE mg/dL   Protein, ur NEGATIVE NEGATIVE mg/dL   Nitrite NEGATIVE NEGATIVE   Leukocytes,Ua NEGATIVE NEGATIVE  Glucose, capillary  Result Value Ref Range   Glucose-Capillary 283 (H) 70 - 99 mg/dL  Glucose, capillary  Result Value Ref Range   Glucose-Capillary 269 (H)  70 - 99 mg/dL  Glucose, capillary  Result Value Ref Range   Glucose-Capillary 317 (H) 70 - 99 mg/dL  Glucose, capillary  Result Value Ref Range   Glucose-Capillary 273 (H) 70 - 99 mg/dL  Hemoglobin Z0C  Result Value Ref Range   Hgb A1c MFr Bld 7.0 (H) 4.8 - 5.6 %   Mean Plasma Glucose 154.2 mg/dL  Glucose, capillary  Result Value Ref Range   Glucose-Capillary 285 (H) 70 - 99 mg/dL  Glucose, capillary  Result Value Ref Range   Glucose-Capillary 254 (H) 70 - 99 mg/dL  Basic metabolic panel  Result Value Ref Range   Sodium 119 (LL) 135 - 145 mmol/L   Potassium 3.9 3.5 - 5.1 mmol/L   Chloride 86 (L) 98 - 111 mmol/L   CO2 22 22 - 32 mmol/L   Glucose, Bld 220 (H) 70 - 99 mg/dL   BUN 12 8 - 23 mg/dL   Creatinine, Ser 5.85 0.61 - 1.24 mg/dL   Calcium 8.7 (L) 8.9 - 10.3 mg/dL   GFR, Estimated >27 >78 mL/min   Anion gap 11 5 - 15  Glucose, capillary  Result Value Ref Range   Glucose-Capillary 248 (H) 70 - 99 mg/dL  Glucose, capillary  Result Value Ref Range   Glucose-Capillary 235 (H) 70 - 99 mg/dL  CBG monitoring, ED  Result Value Ref Range   Glucose-Capillary 59 (L) 70 - 99 mg/dL  CBG monitoring, ED (now and then every hour for 3 hours)  Result Value Ref Range   Glucose-Capillary 63 (L) 70 - 99 mg/dL  CBG monitoring, ED (now and then every hour for 3 hours)  Result Value Ref Range   Glucose-Capillary 87 70 - 99 mg/dL  CBG monitoring, ED (now and then every hour for 3 hours)  Result Value Ref Range   Glucose-Capillary 80 70 - 99 mg/dL  CBG monitoring, ED  Result Value Ref Range  Glucose-Capillary 55 (L) 70 - 99 mg/dL  CBG monitoring, ED  Result Value Ref Range   Glucose-Capillary 92 70 - 99 mg/dL  CBG monitoring, ED  Result Value Ref Range   Glucose-Capillary 56 (L) 70 - 99 mg/dL  CBG monitoring, ED  Result Value Ref Range   Glucose-Capillary 135 (H) 70 - 99 mg/dL  CBG monitoring, ED  Result Value Ref Range   Glucose-Capillary 130 (H) 70 - 99 mg/dL  CBG  monitoring, ED  Result Value Ref Range   Glucose-Capillary >600 (HH) 70 - 99 mg/dL   Prolonged service time 40 mins   Maryln Manuel, MD [facility]   07/22/2021  9:21 PM How to contact the [facility] Attending or Consulting provider 7A - 7P or covering provider during after hours 7P -7A, for this patient?  Check the care team in [facility] and look for a) attending/consulting [facility] provider listed and b) the [facility] team listed Log into [email or web address] and use [place]'s universal password to access. If you do not have the password, please contact the hospital operator. Locate the [facility] provider you are looking for under [facility] and page to a number that you can be directly reached. If you still have difficulty reaching the provider, please page the [facility] (Director on Call) for the Hospitalists listed on amion for assistance.

## 2021-07-24 ENCOUNTER — Inpatient Hospital Stay (HOSPITAL_COMMUNITY): Payer: Medicare Other

## 2021-07-24 DIAGNOSIS — R55 Syncope and collapse: Secondary | ICD-10-CM

## 2021-07-24 LAB — CBC WITH DIFFERENTIAL/PLATELET
Abs Immature Granulocytes: 0.15 10*3/uL — ABNORMAL HIGH (ref 0.00–0.07)
Basophils Absolute: 0.1 10*3/uL (ref 0.0–0.1)
Basophils Relative: 0 %
Eosinophils Absolute: 0 10*3/uL (ref 0.0–0.5)
Eosinophils Relative: 0 %
HCT: 37.6 % — ABNORMAL LOW (ref 39.0–52.0)
Hemoglobin: 12.9 g/dL — ABNORMAL LOW (ref 13.0–17.0)
Immature Granulocytes: 1 %
Lymphocytes Relative: 8 %
Lymphs Abs: 1.6 10*3/uL (ref 0.7–4.0)
MCH: 30.6 pg (ref 26.0–34.0)
MCHC: 34.3 g/dL (ref 30.0–36.0)
MCV: 89.1 fL (ref 80.0–100.0)
Monocytes Absolute: 1.2 10*3/uL — ABNORMAL HIGH (ref 0.1–1.0)
Monocytes Relative: 6 %
Neutro Abs: 17.6 10*3/uL — ABNORMAL HIGH (ref 1.7–7.7)
Neutrophils Relative %: 85 %
Platelets: 231 10*3/uL (ref 150–400)
RBC: 4.22 MIL/uL (ref 4.22–5.81)
RDW: 13.8 % (ref 11.5–15.5)
WBC: 20.6 10*3/uL — ABNORMAL HIGH (ref 4.0–10.5)
nRBC: 0 % (ref 0.0–0.2)

## 2021-07-24 LAB — GLUCOSE, CAPILLARY
Glucose-Capillary: 178 mg/dL — ABNORMAL HIGH (ref 70–99)
Glucose-Capillary: 200 mg/dL — ABNORMAL HIGH (ref 70–99)
Glucose-Capillary: 217 mg/dL — ABNORMAL HIGH (ref 70–99)
Glucose-Capillary: 222 mg/dL — ABNORMAL HIGH (ref 70–99)
Glucose-Capillary: 161 mg/dL — ABNORMAL HIGH (ref 70–99)
Glucose-Capillary: 210 mg/dL — ABNORMAL HIGH (ref 70–99)

## 2021-07-24 LAB — ECHOCARDIOGRAM COMPLETE
AR max vel: 2.74 cm2
AV Area VTI: 2.66 cm2
AV Area mean vel: 2.51 cm2
AV Mean grad: 3 mmHg
AV Peak grad: 5.5 mmHg
Ao pk vel: 1.17 m/s
Area-P 1/2: 2.99 cm2
Calc EF: 50.9 %
Height: 68 in
MV VTI: 2.23 cm2
S' Lateral: 2.5 cm
Single Plane A2C EF: 44.3 %
Single Plane A4C EF: 61.1 %
Weight: 2511.48 oz

## 2021-07-24 LAB — BLOOD GAS, VENOUS
Acid-Base Excess: 1.9 mmol/L (ref 0.0–2.0)
Bicarbonate: 24.8 mmol/L (ref 20.0–28.0)
Drawn by: 6381
FIO2: 21
O2 Saturation: 54.6 %
Patient temperature: 37.9
pCO2, Ven: 45.6 mmHg (ref 44.0–60.0)
pH, Ven: 7.384 (ref 7.250–7.430)
pO2, Ven: <31 mmHg — CL (ref 32.0–45.0)

## 2021-07-24 LAB — BASIC METABOLIC PANEL
Anion gap: 10 (ref 5–15)
Anion gap: 8 (ref 5–15)
BUN: 12 mg/dL (ref 8–23)
BUN: 13 mg/dL (ref 8–23)
CO2: 22 mmol/L (ref 22–32)
CO2: 23 mmol/L (ref 22–32)
Calcium: 8.2 mg/dL — ABNORMAL LOW (ref 8.9–10.3)
Calcium: 8 mg/dL — ABNORMAL LOW (ref 8.9–10.3)
Chloride: 91 mmol/L — ABNORMAL LOW (ref 98–111)
Chloride: 93 mmol/L — ABNORMAL LOW (ref 98–111)
Creatinine, Ser: 0.98 mg/dL (ref 0.61–1.24)
Creatinine, Ser: 0.95 mg/dL (ref 0.61–1.24)
GFR, Estimated: >60 mL/min (ref >60)
GFR, Estimated: >60 mL/min (ref >60)
Glucose, Bld: 180 mg/dL — ABNORMAL HIGH (ref 70–99)
Glucose, Bld: 220 mg/dL — ABNORMAL HIGH (ref 70–99)
Potassium: 4.6 mmol/L (ref 3.5–5.1)
Potassium: 4.6 mmol/L (ref 3.5–5.1)
Sodium: 123 mmol/L — ABNORMAL LOW (ref 135–145)
Sodium: 124 mmol/L — ABNORMAL LOW (ref 135–145)

## 2021-07-24 LAB — MAGNESIUM: Magnesium: 1.7 mg/dL (ref 1.7–2.4)

## 2021-07-24 LAB — PROCALCITONIN: Procalcitonin: 1.19 ng/mL

## 2021-07-24 MED ORDER — LORAZEPAM 2 MG/ML IJ SOLN
1.0000 mg | Freq: Once | INTRAMUSCULAR | Status: AC
  Administered 2021-07-24: 01:00:00 1 mg via INTRAVENOUS
  Filled 2021-07-24: qty 1

## 2021-07-24 NOTE — Progress Notes (Signed)
Patient d/c home, 20G IV cath removed from LEFT [facility] w/catheter intact, spouse & daughter at bedside to transfer patient home, spouse confirmed that she had all of the patient's belongings, d/c instructions provided to patient & spouse

## 2021-07-24 NOTE — Discharge Summary (Signed)
Physician Discharge Summary  Scott Garner WUJ:[id number] DOB: 1946-04-09 DOA: 07/22/2021  PCP: Assunta Found, MD Endocrinologist: Dr. Talmage Nap   Admit date: 07/22/2021 Discharge date: 07/24/2021  Admitted From:  HOME  Disposition: HOME   Recommendations for Outpatient Follow-up:  Follow up with PCP in 1 weeks Follow up with endocrinologist in 1-2 weeks  Please monitor blood sugars very closely over next few days  Please follow up on cortisol level (still pending on day of DC)   Discharge Condition: STABLE   CODE STATUS: FULL DIET: Carb modified     Brief Hospitalization Summary: Please see all hospital notes, images, labs for full details of the hospitalization. ADMISSION HPI:  75 y.o. male, with history of type 1 diabetes mellitus, hyperlipidemia, hypertension, presents ED with a chief complaint of hypoglycemia.  Wife reports that patient wears the libre freestyle.  At 5:30 PM they were getting warnings that it was time to change upon.  Wife reports a change in pump at 530, and then around 7 PM she started having alarm going off.  She noticed that all of the insulin from the pump was gone.  The approximate it was 200 units.  They are not sure what is his normal basal rate is but wife reports she is going to find out.  He gave him a cup of milk, and 2 Welches juices.  She cannot get his glucose to come up over 50, so she called EMS.  EMS then gave him D50 in route.  Highest glucose since the episode started was 117 per wife.  They have never had anything like this happen before.  Patient does occasionally have lows but not like this.  The difference is this is a longer duration.  Patient reports that he knew the hypoglycemia was affecting him but he cannot characterize the symptoms.  He reports that he was not shaky, diaphoretic, flushed, or near syncopal.  With that being said he did have a syncopal episode in the ED while in route to the ICU.  Please see nursing note.  Patient does report  nausea once he is here in the hospital.  He said no chest pain.  Otherwise he has been in his normal state of health.  Wife reports patient has been on the insulin pump for 12 years.  He was diagnosed with type 1 diabetes when he was 25.  His sugars have been controlled  Patient does not smoke, does not drink, does not use illicit drugs.  Is vaccinated for COVID. Code.  In the ED vital signs stable, hematology stable, chemistry unremarkable aside from hyporglycemia.  Admission requested for observation in the setting of high hypoglycemia  HOSPITAL COURSE Pt was admitted with prolonged hypoglycemia from insulin pump (presumed from user error).  Patient was briefly placed on a continuous dextrose infusion with frequent CBG monitoring until blood sugar stabilized in there was no further hypoglycemia.  He was also noticed to have some dehydration and treated with IV fluid boluses and was initially going to be discharged home on 07/23/2021 however when standing up he had an episode of near syncope and emesis.  A stat BMP was checked which showed his sodium dropped to 119.  After IV fluids it has improved to his baseline of 124-126.  He feels better.  His discharge was held and he was given IV fluid boluses and a 2D echocardiogram and carotid Doppler studies ordered as well as orthostatic vital signs.  He responded well to IV fluid hydration.  I was able to speak with his endocrinologist who is cared for him for 18 years and she reported that patient has a history of syncope associated with chronic hyponatremia.  He has been thoroughly worked up for this and I cause has not been determined at this time.  She recommended adding on cortisol level.  She also will meet with the patient and wife outpatient to discuss ongoing insulin pump use.  She reports that for the most part his blood glucose has been fairly well controlled.  He has done well on the insulin pump for many years.  Has been restarted on his insulin  pump.  Wife called the pump company and they interrogated the pump and it seems to be functioning properly at this time.  I have recommended that she monitor her blood glucose frequently over the next several days to be sure that it continues to function properly and to follow-up with endocrinologist.  His 2D echo and carotid doppler studies were reassuring. PT evaluated him and he did very well and no PT needs identified.     Discharge Diagnoses:  Principal Problem:   Hypoglycemia Active Problems:   Syncope and collapse   Discharge Instructions:  Allergies as of 07/24/2021       Reactions   Neosporin [bacitracin-polymyxin B]    hives        Medication List     TAKE these medications    amLODipine 5 MG tablet Commonly known as: NORVASC Take 5 mg by mouth daily.   aspirin EC 81 MG tablet Take 81 mg by mouth daily.   COD LIVER OIL PO Take 1 capsule by mouth daily.   fish oil-omega-3 fatty acids 1000 MG capsule Take 1 g by mouth daily.   insulin aspart 100 UNIT/ML injection Commonly known as: novoLOG Inject into the skin continuous. Patient has an insulin pump.   INSULIN SYRINGE .3CC/30GX5/16" 30G X 5/16" 0.3 ML Misc Use as directed   lisinopril 20 MG tablet Commonly known as: ZESTRIL Take 1 tablet (20 mg total) by mouth daily.   Lumigan 0.01 % Soln Generic drug: bimatoprost Place 1 drop into both eyes at bedtime.   multivitamin with minerals Tabs tablet Take 1 tablet by mouth daily.   pravastatin 10 MG tablet Commonly known as: PRAVACHOL Take 10 mg by mouth at bedtime.        Follow-up Information     Assunta Found, MD. Schedule an appointment as soon as possible for a visit.   Specialty: Family Medicine Why: Hospital Follow Up Contact information: [address] [place] [address] [phone]         Dorisann Frames, MD. Schedule an appointment as soon as possible for a visit in 1 week(s).   Specialty: Endocrinology Why: Hospital  Follow Up Contact information: [address] [place] [address] [place] [address] [phone]                Allergies  Allergen Reactions   Neosporin [Bacitracin-Polymyxin B]     hives   Allergies as of 07/24/2021       Reactions   Neosporin [bacitracin-polymyxin B]    hives        Medication List     TAKE these medications    amLODipine 5 MG tablet Commonly known as: NORVASC Take 5 mg by mouth daily.   aspirin EC 81 MG tablet Take 81 mg by mouth daily.   COD LIVER OIL PO Take 1 capsule by mouth daily.  fish oil-omega-3 fatty acids 1000 MG capsule Take 1 g by mouth daily.   insulin aspart 100 UNIT/ML injection Commonly known as: novoLOG Inject into the skin continuous. Patient has an insulin pump.   INSULIN SYRINGE .3CC/30GX5/16" 30G X 5/16" 0.3 ML Misc Use as directed   lisinopril 20 MG tablet Commonly known as: ZESTRIL Take 1 tablet (20 mg total) by mouth daily.   Lumigan 0.01 % Soln Generic drug: bimatoprost Place 1 drop into both eyes at bedtime.   multivitamin with minerals Tabs tablet Take 1 tablet by mouth daily.   pravastatin 10 MG tablet Commonly known as: PRAVACHOL Take 10 mg by mouth at bedtime.        Procedures/Studies: US Carotid Bilateral  Result Date: 07/24/2021 CLINICAL DATA:  Syncope with collapse EXAM: BILATERAL CAROTID DUPLEX ULTRASOUND TECHNIQUE: Wallace Cullens scale imaging, color Doppler and duplex ultrasound were performed of bilateral carotid and vertebral arteries in the neck. COMPARISON:  None. FINDINGS: Criteria: Quantification of carotid stenosis is based on velocity parameters that correlate the residual internal carotid diameter with NASCET-based stenosis levels, using the diameter of the distal internal carotid lumen as the denominator for stenosis measurement. The following velocity measurements were obtained: RIGHT ICA: 88/20 cm/sec CCA: 97/14 cm/sec SYSTOLIC ICA/CCA RATIO:  0.9 ECA:  120 cm/sec LEFT ICA: 73/12  cm/sec CCA: 91/10 cm/sec SYSTOLIC ICA/CCA RATIO:  0.8 ECA:  118 cm/sec RIGHT CAROTID ARTERY: Trace smooth heterogeneous atherosclerotic plaque in the proximal internal carotid artery. By peak systolic velocity criteria, the estimated stenosis is less than 50%. RIGHT VERTEBRAL ARTERY:  Patent with normal antegrade flow. LEFT CAROTID ARTERY: Heterogeneous atherosclerotic plaque in the proximal internal carotid artery. By peak systolic velocity criteria, the estimated stenosis is less than 50%. LEFT VERTEBRAL ARTERY:  Patent with normal antegrade flow. IMPRESSION: 1. Mild (1-49%) stenosis proximal right internal carotid artery secondary to mild heterogeneous atherosclerotic plaque. 2. Mild (1-49%) stenosis proximal left internal carotid artery secondary to heterogenous atherosclerotic plaque. 3. Vertebral arteries are patent with normal antegrade flow. Electronically Signed   By: Malachy Moan M.D.   On: 07/24/2021 13:17   DG CHEST PORT 1 VIEW  Result Date: 07/24/2021 CLINICAL DATA:  Leukocytosis EXAM: PORTABLE CHEST 1 VIEW COMPARISON:  None. FINDINGS: Heart size is normal. Mild aortic atherosclerotic calcification is noted. Mild patchy density in both lower lobes could be atelectasis or mild basilar pneumonia. Upper lungs are clear. No evidence of heart failure or effusion. Chronic left clavicle fracture, possibly with nonunion. IMPRESSION: Patchy density at both lung bases that could be atelectasis or atelectatic pneumonia. Electronically Signed   By: Paulina Fusi M.D.   On: 07/24/2021 08:02   ECHOCARDIOGRAM COMPLETE  Result Date: 07/24/2021    ECHOCARDIOGRAM REPORT   Patient Name:   BADER STUBBLEFIELD [facility] Date of Exam: 07/24/2021 Medical Rec #:  [id number]      Height:       68.0 in Accession #:    [id number]     Weight:       157.0 lb Date of Birth:  [birth year]     BSA:          1.844 m Patient Age:    75 years       BP:           147/64 mmHg Patient Gender: M              HR:           79 bpm. [facility]  Procedure: 2D Echo, Cardiac Doppler and Color Doppler Indications:    Syncope  History:        Patient has no prior history of Echocardiogram examinations.                 Signs/Symptoms:Syncope.  Sonographer:    Mikki Harbor Referring Phys: [phone] Arrow Emmerich L Macklyn Glandon IMPRESSIONS  1. Left ventricular ejection fraction, by estimation, is 65 to 70%. The left ventricle has normal function. The left ventricle has no regional wall motion abnormalities. There is mild left ventricular hypertrophy. Left ventricular diastolic parameters are indeterminate.  2. Right ventricular systolic function is normal. The right ventricular size is normal. There is normal pulmonary artery systolic pressure.  3. Mild mitral valve regurgitation.  4. The aortic valve is tricuspid. Aortic valve regurgitation is not visualized. Aortic valve sclerosis/calcification is present, without any evidence of aortic stenosis.  5. The inferior vena cava is normal in size with greater than 50% respiratory variability, suggesting right atrial pressure of 3 mmHg. FINDINGS  Left Ventricle: Left ventricular ejection fraction, by estimation, is 65 to 70%. The left ventricle has normal function. The left ventricle has no regional wall motion abnormalities. The left ventricular internal cavity size was normal in size. There is  mild left ventricular hypertrophy. Left ventricular diastolic parameters are indeterminate. Right Ventricle: The right ventricular size is normal. Right vetricular wall thickness was not assessed. Right ventricular systolic function is normal. There is normal pulmonary artery systolic pressure. The tricuspid regurgitant velocity is 2.71 m/s, and with an assumed right atrial pressure of 3 mmHg, the estimated right ventricular systolic pressure is 32.4 mmHg. Left Atrium: Left atrial size was normal in size. Right Atrium: Right atrial size was normal in size. Pericardium: There is no evidence of pericardial effusion. Mitral  Valve: There is mild thickening of the mitral valve leaflet(s). Mild mitral annular calcification. Mild mitral valve regurgitation. MV peak gradient, 4.9 mmHg. The mean mitral valve gradient is 2.0 mmHg. Tricuspid Valve: The tricuspid valve is normal in structure. Tricuspid valve regurgitation is trivial. Aortic Valve: The aortic valve is tricuspid. Aortic valve regurgitation is not visualized. Aortic valve sclerosis/calcification is present, without any evidence of aortic stenosis. Aortic valve mean gradient measures 3.0 mmHg. Aortic valve peak gradient measures 5.5 mmHg. Aortic valve area, by VTI measures 2.66 cm. Pulmonic Valve: The pulmonic valve was normal in structure. Pulmonic valve regurgitation is not visualized. Aorta: The aortic root is normal in size and structure. Venous: The inferior vena cava is normal in size with greater than 50% respiratory variability, suggesting right atrial pressure of 3 mmHg. IAS/Shunts: No atrial level shunt detected by color flow Doppler.  LEFT VENTRICLE PLAX 2D LVIDd:         4.30 cm     Diastology LVIDs:         2.50 cm     LV e' medial:    6.64 cm/s LV PW:         1.00 cm     LV E/e' medial:  14.3 LV IVS:        1.20 cm     LV e' lateral:   7.83 cm/s LVOT diam:     2.00 cm     LV E/e' lateral: 12.1 LV SV:         70 LV SV Index:   38 LVOT Area:     3.14 cm  LV Volumes (MOD) LV vol d, MOD A2C: 55.8 ml LV vol d, MOD A4C:  62.8 ml LV vol s, MOD A2C: 31.1 ml LV vol s, MOD A4C: 24.4 ml LV SV MOD A2C:     24.7 ml LV SV MOD A4C:     62.8 ml LV SV MOD BP:      30.8 ml RIGHT VENTRICLE RV Basal diam:  3.35 cm RV Mid diam:    4.00 cm RV S prime:     10.90 cm/s TAPSE (M-mode): 2.6 cm LEFT ATRIUM             Index        RIGHT ATRIUM           Index LA diam:        3.50 cm 1.90 cm/m   RA Area:     16.10 cm LA Vol (A2C):   55.1 ml 29.88 ml/m  RA Volume:   41.10 ml  22.29 ml/m LA Vol (A4C):   45.4 ml 24.62 ml/m LA Biplane Vol: 51.8 ml 28.09 ml/m  AORTIC VALVE                     PULMONIC VALVE AV Area (Vmax):    2.74 cm     PV Vmax:       0.84 m/s AV Area (Vmean):   2.51 cm     PV Peak grad:  2.8 mmHg AV Area (VTI):     2.66 cm AV Vmax:           117.00 cm/s AV Vmean:          80.500 cm/s AV VTI:            0.262 m AV Peak Grad:      5.5 mmHg AV Mean Grad:      3.0 mmHg LVOT Vmax:         102.00 cm/s LVOT Vmean:        64.400 cm/s LVOT VTI:          0.222 m LVOT/AV VTI ratio: 0.85  AORTA Ao Root diam: 3.20 cm MITRAL VALVE                TRICUSPID VALVE MV Area (PHT): 2.99 cm     TR Peak grad:   29.4 mmHg MV Area VTI:   2.23 cm     TR Vmax:        271.00 cm/s MV Peak grad:  4.9 mmHg MV Mean grad:  2.0 mmHg     SHUNTS MV Vmax:       1.11 m/s     Systemic VTI:  0.22 m MV Vmean:      60.9 cm/s    Systemic Diam: 2.00 cm MV Decel Time: 254 msec MV E velocity: 94.70 cm/s MV A velocity: 107.00 cm/s MV E/A ratio:  0.89 Dietrich Pates MD Electronically signed by Dietrich Pates MD Signature Date/Time: 07/24/2021/1:02:29 PM    Final      Subjective: Patient reports he is feeling much better today.  He is no longer having any of the presyncopal symptoms that he was having yesterday.  His blood sugars are much better and he is back on the insulin pump at this time.  He has no shortness of breath or chest pain symptoms.  Discharge Exam: Vitals:   07/24/21 0900 07/24/21 1100  BP: (!) 147/64   Pulse: 79   Resp: 20   Temp:  97.7 F (36.5 C)  SpO2: 92%    Vitals:   07/24/21 0800 07/24/21 0852 07/24/21  0900 07/24/21 1100  BP: (!) 156/67  (!) 147/64   Pulse: 77 80 79   Resp: 16 (!) 26 20   Temp: 98.6 F (37 C)   97.7 F (36.5 C)  TempSrc: Axillary   Oral  SpO2: 96% 93% 92%   Weight:      Height:       General: Pt is alert, awake, not in acute distress Cardiovascular: normal S1/S2 +, no rubs, no gallops Respiratory: CTA bilaterally, no wheezing, no rhonchi Abdominal: Soft, NT, ND, bowel sounds + Extremities: no edema, no cyanosis   The results of significant diagnostics from this  hospitalization (including imaging, microbiology, ancillary and laboratory) are listed below for reference.     Microbiology: Recent Results (from the past 240 hour(s))  Resp Panel by RT-PCR (Flu A&B, Covid) Nasopharyngeal Swab     Status: None   Collection Time: 07/23/21  2:10 AM   Specimen: Nasopharyngeal Swab; Nasopharyngeal(NP) swabs in vial transport medium  Result Value Ref Range Status   SARS Coronavirus 2 by RT PCR NEGATIVE NEGATIVE Final    Comment: (NOTE) SARS-CoV-2 target nucleic acids are NOT DETECTED.  The SARS-CoV-2 RNA is generally detectable in upper respiratory specimens during the acute phase of infection. The lowest concentration of SARS-CoV-2 viral copies this assay can detect is 138 copies/mL. A negative result does not preclude SARS-Cov-2 infection and should not be used as the sole basis for treatment or other patient management decisions. A negative result may occur with  improper specimen collection/handling, submission of specimen other than nasopharyngeal swab, presence of viral mutation(s) within the areas targeted by this assay, and inadequate number of viral copies(<138 copies/mL). A negative result must be combined with clinical observations, patient history, and epidemiological information. The expected result is Negative.  Fact Sheet for Patients:  BloggerCourse.com  Fact Sheet for Healthcare Providers:  SeriousBroker.it  This test is no t yet approved or cleared by the [place] FDA and  has been authorized for detection and/or diagnosis of SARS-CoV-2 by FDA under an Emergency Use Authorization (EUA). This EUA will remain  in effect (meaning this test can be used) for the duration of the COVID-19 declaration under Section 564(b)(1) of the Act, 21 U.S.C.section 360bbb-3(b)(1), unless the authorization is terminated  or revoked sooner.       Influenza A by PCR NEGATIVE NEGATIVE Final    Influenza B by PCR NEGATIVE NEGATIVE Final    Comment: (NOTE) The Xpert Xpress SARS-CoV-2/FLU/RSV plus assay is intended as an aid in the diagnosis of influenza from Nasopharyngeal swab specimens and should not be used as a sole basis for treatment. Nasal washings and aspirates are unacceptable for Xpert Xpress SARS-CoV-2/FLU/RSV testing.  Fact Sheet for Patients: BloggerCourse.com  Fact Sheet for Healthcare Providers: SeriousBroker.it  This test is not yet approved or cleared by the [place] FDA and has been authorized for detection and/or diagnosis of SARS-CoV-2 by FDA under an Emergency Use Authorization (EUA). This EUA will remain in effect (meaning this test can be used) for the duration of the COVID-19 declaration under Section 564(b)(1) of the Act, 21 U.S.C. section 360bbb-3(b)(1), unless the authorization is terminated or revoked.  Performed at [facility], [address] [place], [place] [address]      Labs: BNP (last 3 results) No results for input(s): BNP in the last 8760 hours. Basic Metabolic Panel: Recent Labs  Lab 07/22/21 2321 07/23/21 0347 07/23/21 1528 07/24/21 0103 07/24/21 0434  NA 126* 126* 119* 123* 124*  K 3.5 4.8 3.9 4.6 4.6  CL 93* 90* 86* 91* 93*  CO2 25 28 22 22 23   GLUCOSE 53* 109* 220* 180* 220*  BUN 12 10 12 12 13   CREATININE 0.67 0.63 0.78 0.98 0.95  CALCIUM 9.1 8.9 8.7* 8.2* 8.0*  MG  --  1.9  --   --  1.7   Liver Function Tests: Recent Labs  Lab 07/23/21 0347  AST 30  ALT 22  ALKPHOS 58  BILITOT 0.8  PROT 6.3*  ALBUMIN 4.0   No results for input(s): LIPASE, AMYLASE in the last 168 hours. No results for input(s): AMMONIA in the last 168 hours. CBC: Recent Labs  Lab 07/22/21 2321 07/23/21 0347 07/24/21 0434  WBC 7.6 7.0 20.6*  NEUTROABS  --  4.1 17.6*  HGB 13.9 14.2 12.9*  HCT 39.8 40.8 37.6*  MCV 89.4 89.7 89.1  PLT 267 283 231   Cardiac Enzymes: No results  for input(s): CKTOTAL, CKMB, CKMBINDEX, TROPONINI in the last 168 hours. BNP: Invalid input(s): POCBNP CBG: Recent Labs  Lab 07/24/21 0305 07/24/21 0529 07/24/21 0742 07/24/21 1143 07/24/21 1417  GLUCAP 200* 217* 222* 161* 210*   D-Dimer No results for input(s): DDIMER in the last 72 hours. Hgb A1c Recent Labs    07/23/21 0347  HGBA1C 7.0*   Lipid Profile No results for input(s): CHOL, HDL, LDLCALC, TRIG, CHOLHDL, LDLDIRECT in the last 72 hours. Thyroid function studies Recent Labs    07/23/21 0347  TSH 2.680   Anemia work up No results for input(s): VITAMINB12, FOLATE, FERRITIN, TIBC, IRON, RETICCTPCT in the last 72 hours. Urinalysis    Component Value Date/Time   COLORURINE YELLOW 07/23/2021 0456   APPEARANCEUR CLEAR 07/23/2021 0456   LABSPEC <1.005 (L) 07/23/2021 0456   PHURINE 6.5 07/23/2021 0456   GLUCOSEU 250 (A) 07/23/2021 0456   HGBUR NEGATIVE 07/23/2021 0456   BILIRUBINUR NEGATIVE 07/23/2021 0456   KETONESUR NEGATIVE 07/23/2021 0456   PROTEINUR NEGATIVE 07/23/2021 0456   UROBILINOGEN 0.2 07/14/2009 1859   NITRITE NEGATIVE 07/23/2021 0456   LEUKOCYTESUR NEGATIVE 07/23/2021 0456   Sepsis Labs Invalid input(s): PROCALCITONIN,  WBC,  LACTICIDVEN Microbiology Recent Results (from the past 240 hour(s))  Resp Panel by RT-PCR (Flu A&B, Covid) Nasopharyngeal Swab     Status: None   Collection Time: 07/23/21  2:10 AM   Specimen: Nasopharyngeal Swab; Nasopharyngeal(NP) swabs in vial transport medium  Result Value Ref Range Status   SARS Coronavirus 2 by RT PCR NEGATIVE NEGATIVE Final    Comment: (NOTE) SARS-CoV-2 target nucleic acids are NOT DETECTED.  The SARS-CoV-2 RNA is generally detectable in upper respiratory specimens during the acute phase of infection. The lowest concentration of SARS-CoV-2 viral copies this assay can detect is 138 copies/mL. A negative result does not preclude SARS-Cov-2 infection and should not be used as the sole basis for  treatment or other patient management decisions. A negative result may occur with  improper specimen collection/handling, submission of specimen other than nasopharyngeal swab, presence of viral mutation(s) within the areas targeted by this assay, and inadequate number of viral copies(<138 copies/mL). A negative result must be combined with clinical observations, patient history, and epidemiological information. The expected result is Negative.  Fact Sheet for Patients:  BloggerCourse.com  Fact Sheet for Healthcare Providers:  SeriousBroker.it  This test is no t yet approved or cleared by the [place] FDA and  has been authorized for detection and/or diagnosis of SARS-CoV-2 by FDA under an Emergency Use Authorization (EUA).  This EUA will remain  in effect (meaning this test can be used) for the duration of the COVID-19 declaration under Section 564(b)(1) of the Act, 21 U.S.C.section 360bbb-3(b)(1), unless the authorization is terminated  or revoked sooner.       Influenza A by PCR NEGATIVE NEGATIVE Final   Influenza B by PCR NEGATIVE NEGATIVE Final    Comment: (NOTE) The Xpert Xpress SARS-CoV-2/FLU/RSV plus assay is intended as an aid in the diagnosis of influenza from Nasopharyngeal swab specimens and should not be used as a sole basis for treatment. Nasal washings and aspirates are unacceptable for Xpert Xpress SARS-CoV-2/FLU/RSV testing.  Fact Sheet for Patients: BloggerCourse.com  Fact Sheet for Healthcare Providers: SeriousBroker.it  This test is not yet approved or cleared by the [place] FDA and has been authorized for detection and/or diagnosis of SARS-CoV-2 by FDA under an Emergency Use Authorization (EUA). This EUA will remain in effect (meaning this test can be used) for the duration of the COVID-19 declaration under Section 564(b)(1) of the Act, 21  U.S.C. section 360bbb-3(b)(1), unless the authorization is terminated or revoked.  Performed at [facility], [address] [place], [place] [address]    Time coordinating discharge: 35 mins   SIGNED:  Standley Dakins, MD  [facility] 07/24/2021, 3:08 PM How to contact the [facility] Attending or Consulting provider 7A - 7P or covering provider during after hours 7P -7A, for this patient?  Check the care team in [facility] and look for a) attending/consulting [facility] provider listed and b) the [facility] team listed Log into [email or web address] and use [place]'s universal password to access. If you do not have the password, please contact the hospital operator. Locate the [facility] provider you are looking for under [facility] and page to a number that you can be directly reached. If you still have difficulty reaching the provider, please page the [facility] (Director on Call) for the Hospitalists listed on amion for assistance.

## 2021-07-24 NOTE — Progress Notes (Signed)
*  PRELIMINARY RESULTS* Echocardiogram 2D Echocardiogram has been performed.  Carolyne Fiscal 07/24/2021, 11:38 AM

## 2021-07-24 NOTE — Evaluation (Signed)
Physical Therapy Evaluation Patient Details Name: Scott Garner MRN: [id number] DOB: 08/10/1945 Today's Date: 07/24/2021  History of Present Illness  Scott Garner  is a 75 y.o. male, with history of type 1 diabetes mellitus, hyperlipidemia, hypertension, presents ED with a chief complaint of hypoglycemia.  Wife reports that patient wears the libre freestyle.  At 5:30 PM they were getting warnings that it was time to change upon.  Wife reports a change in pump at 530, and then around 7 PM she started having alarm going off.  She noticed that all of the insulin from the pump was gone.  The approximate it was 200 units.  They are not sure what is his normal basal rate is but wife reports she is going to find out.  He gave him a cup of milk, and 2 Welches juices.  She cannot get his glucose to come up over 50, so she called EMS.  EMS then gave him D50 in route.  Highest glucose since the episode started was 117 per wife.  They have never had anything like this happen before.  Patient does occasionally have lows but not like this.  The difference is this is a longer duration.  Patient reports that he knew the hypoglycemia was affecting him but he cannot characterize the symptoms.  He reports that he was not shaky, diaphoretic, flushed, or near syncopal.  With that being said he did have a syncopal episode in the ED while in route to the ICU.  Please see nursing note.  Patient does report nausea once he is here in the hospital.  He said no chest pain.  Otherwise he has been in his normal state of health.   Clinical Impression  Patient functioning near baseline for functional mobility and gait other than slightly impulsive behavior requiring occasional verbal/tactile cueing for safety, otherwise demonstrated good return for transfers and ambulation in room and hallways without loss of balance.  Plan:  Patient discharged from physical therapy to care of nursing for ambulation daily as tolerated for length of  stay.         Recommendations for follow up therapy are one component of a multi-disciplinary discharge planning process, led by the attending physician.  Recommendations may be updated based on patient status, additional functional criteria and insurance authorization.  Follow Up Recommendations No PT follow up    Assistance Recommended at Discharge Intermittent Supervision/Assistance  Functional Status Assessment Patient has had a recent decline in their functional status and/or demonstrates limited ability to make significant improvements in function in a reasonable and predictable amount of time  Equipment Recommendations  None recommended by PT    Recommendations for Other Services       Precautions / Restrictions Precautions Precautions: None Restrictions Weight Bearing Restrictions: No      Mobility  Bed Mobility Overal bed mobility: Modified Independent                  Transfers Overall transfer level: Needs assistance Equipment used: None;1 person hand held assist Transfers: Sit to/from Stand;Bed to chair/wheelchair/BSC Sit to Stand: Supervision   Step pivot transfers: Supervision       General transfer comment: slightly impulsive, occasional verbal/tactile cueing for safety    Ambulation/Gait Ambulation/Gait assistance: Modified independent (Device/Increase time);Supervision Gait Distance (Feet): 200 Feet Assistive device: None Gait Pattern/deviations: WFL(Within Functional Limits) Gait velocity: slightly decreased     General Gait Details: grossly WFL with good return for ambulation in room and hallways without loss  of balance  Stairs            Wheelchair Mobility    Modified Rankin (Stroke Patients Only)       Balance Overall balance assessment: Mild deficits observed, not formally tested                                           Pertinent Vitals/Pain Pain Assessment: No/denies pain    Home Living  Family/patient expects to be discharged to:: Private residence Living Arrangements: Spouse/significant other Available Help at Discharge: Family;Available 24 hours/day Type of Home: House Home Access: Stairs to enter Entrance Stairs-Rails: None Entrance Stairs-Number of Steps: 3 Alternate Level Stairs-Number of Steps: 12-15 Home Layout: Laundry or work area in basement;Two level Home Equipment: Agricultural consultant (2 wheels);Cane - single point;Shower seat      Prior Function Prior Level of Function : Independent/Modified Independent             Mobility Comments: community ambulator, drives ADLs Comments: Independent     Hand Dominance        Extremity/Trunk Assessment   Upper Extremity Assessment Upper Extremity Assessment: Overall WFL for tasks assessed    Lower Extremity Assessment Lower Extremity Assessment: Overall WFL for tasks assessed    Cervical / Trunk Assessment Cervical / Trunk Assessment: Normal  Communication   Communication: No difficulties  Cognition Arousal/Alertness: Awake/alert Behavior During Therapy: WFL for tasks assessed/performed;Impulsive Overall Cognitive Status: Within Functional Limits for tasks assessed                                          General Comments      Exercises     Assessment/Plan    PT Assessment Patient does not need any further PT services  PT Problem List         PT Treatment Interventions      PT Goals (Current goals can be found in the Care Plan section)  Acute Rehab PT Goals Patient Stated Goal: return home with family to assist PT Goal Formulation: With patient/family Time For Goal Achievement: 07/24/21 Potential to Achieve Goals: Good    Frequency     Barriers to discharge        Co-evaluation               AM-PAC PT "6 Clicks" Mobility  Outcome Measure Help needed turning from your back to your side while in a flat bed without using bedrails?: None Help needed moving  from lying on your back to sitting on the side of a flat bed without using bedrails?: None Help needed moving to and from a bed to a chair (including a wheelchair)?: None Help needed standing up from a chair using your arms (e.g., wheelchair or bedside chair)?: None Help needed to walk in hospital room?: None Help needed climbing 3-5 steps with a railing? : None 6 Click Score: 24    End of Session   Activity Tolerance: Patient tolerated treatment well Patient left: in chair;with call bell/phone within reach;with family/visitor present Nurse Communication: Mobility status PT Visit Diagnosis: Unsteadiness on feet (R26.81);Other abnormalities of gait and mobility (R26.89);Muscle weakness (generalized) (M62.81)    Time: 0938-1829 PT Time Calculation (min) (ACUTE ONLY): 20 min   Charges:   PT Evaluation $PT Eval Moderate  Complexity: 1 Mod PT Treatments $Therapeutic Activity: 8-22 mins        3:40 PM, 07/24/21 Ocie Bob, MPT Physical Therapist with [facility] [phone] office [phone] mobile phone

## 2021-07-24 NOTE — Consult Note (Signed)
WOC Nurse wound consult note Consultation completed by use of remote telehealth camera El[employer] technology and with assistance from bedside nurse/clinical staff  Reason for Consult:left knee wound Wound type: trauma to area, unclear of timing  Pressure Injury POA: NA Measurement: largest area medial aspect of the patella, 0.5cm x 0.5cm x 0.2cm  Wound bed: fibrinous, pink, moist, clean, other area scabbed  Drainage (amount, consistency, odor) scant, serous Periwound: intact, no s/s of infection  Dressing procedure/placement/frequency:  Continue silicone foam to protect and insulate. No other new needs identified.   Discussed POC with patient and bedside nurse.  Re consult if needed, will not follow at this time. Thanks  Brunella Wileman [employer], RN,CWOCN, CNS, CWON-AP [phone])

## 2021-07-29 DIAGNOSIS — Z6824 Body mass index (BMI) 24.0-24.9, adult: Secondary | ICD-10-CM | POA: Diagnosis not present

## 2021-07-29 DIAGNOSIS — B356 Tinea cruris: Secondary | ICD-10-CM | POA: Diagnosis not present

## 2021-07-29 DIAGNOSIS — E663 Overweight: Secondary | ICD-10-CM | POA: Diagnosis not present

## 2021-07-29 DIAGNOSIS — E162 Hypoglycemia, unspecified: Secondary | ICD-10-CM | POA: Diagnosis not present

## 2021-07-29 DIAGNOSIS — E119 Type 2 diabetes mellitus without complications: Secondary | ICD-10-CM | POA: Diagnosis not present

## 2021-07-30 DIAGNOSIS — Z9641 Presence of insulin pump (external) (internal): Secondary | ICD-10-CM | POA: Diagnosis not present

## 2021-07-30 DIAGNOSIS — E871 Hypo-osmolality and hyponatremia: Secondary | ICD-10-CM | POA: Diagnosis not present

## 2021-07-30 DIAGNOSIS — I10 Essential (primary) hypertension: Secondary | ICD-10-CM | POA: Diagnosis not present

## 2021-07-30 DIAGNOSIS — E109 Type 1 diabetes mellitus without complications: Secondary | ICD-10-CM | POA: Diagnosis not present

## 2021-08-11 DIAGNOSIS — E1142 Type 2 diabetes mellitus with diabetic polyneuropathy: Secondary | ICD-10-CM | POA: Diagnosis not present

## 2021-08-11 DIAGNOSIS — B351 Tinea unguium: Secondary | ICD-10-CM | POA: Diagnosis not present

## 2021-08-11 DIAGNOSIS — L84 Corns and callosities: Secondary | ICD-10-CM | POA: Diagnosis not present

## 2021-08-12 DIAGNOSIS — E1065 Type 1 diabetes mellitus with hyperglycemia: Secondary | ICD-10-CM | POA: Diagnosis not present

## 2021-08-13 DIAGNOSIS — E109 Type 1 diabetes mellitus without complications: Secondary | ICD-10-CM | POA: Diagnosis not present

## 2021-08-13 DIAGNOSIS — E1065 Type 1 diabetes mellitus with hyperglycemia: Secondary | ICD-10-CM | POA: Diagnosis not present

## 2021-08-31 DIAGNOSIS — L304 Erythema intertrigo: Secondary | ICD-10-CM | POA: Diagnosis not present

## 2021-08-31 DIAGNOSIS — L308 Other specified dermatitis: Secondary | ICD-10-CM | POA: Diagnosis not present

## 2021-09-01 DIAGNOSIS — Z794 Long term (current) use of insulin: Secondary | ICD-10-CM | POA: Diagnosis not present

## 2021-09-01 DIAGNOSIS — Z9641 Presence of insulin pump (external) (internal): Secondary | ICD-10-CM | POA: Diagnosis not present

## 2021-09-01 DIAGNOSIS — E109 Type 1 diabetes mellitus without complications: Secondary | ICD-10-CM | POA: Diagnosis not present

## 2021-09-02 DIAGNOSIS — E109 Type 1 diabetes mellitus without complications: Secondary | ICD-10-CM | POA: Diagnosis not present

## 2021-09-02 DIAGNOSIS — Z794 Long term (current) use of insulin: Secondary | ICD-10-CM | POA: Diagnosis not present

## 2021-09-02 DIAGNOSIS — Z9641 Presence of insulin pump (external) (internal): Secondary | ICD-10-CM | POA: Diagnosis not present

## 2021-09-12 DIAGNOSIS — E1065 Type 1 diabetes mellitus with hyperglycemia: Secondary | ICD-10-CM | POA: Diagnosis not present

## 2021-09-15 DIAGNOSIS — E871 Hypo-osmolality and hyponatremia: Secondary | ICD-10-CM | POA: Diagnosis not present

## 2021-09-15 DIAGNOSIS — E109 Type 1 diabetes mellitus without complications: Secondary | ICD-10-CM | POA: Diagnosis not present

## 2021-09-22 DIAGNOSIS — E7800 Pure hypercholesterolemia, unspecified: Secondary | ICD-10-CM | POA: Diagnosis not present

## 2021-09-22 DIAGNOSIS — R413 Other amnesia: Secondary | ICD-10-CM | POA: Diagnosis not present

## 2021-09-22 DIAGNOSIS — E109 Type 1 diabetes mellitus without complications: Secondary | ICD-10-CM | POA: Diagnosis not present

## 2021-09-22 DIAGNOSIS — Z9641 Presence of insulin pump (external) (internal): Secondary | ICD-10-CM | POA: Diagnosis not present

## 2021-09-22 DIAGNOSIS — R55 Syncope and collapse: Secondary | ICD-10-CM | POA: Diagnosis not present

## 2021-09-22 DIAGNOSIS — E871 Hypo-osmolality and hyponatremia: Secondary | ICD-10-CM | POA: Diagnosis not present

## 2021-09-22 DIAGNOSIS — I10 Essential (primary) hypertension: Secondary | ICD-10-CM | POA: Diagnosis not present

## 2021-09-28 DIAGNOSIS — J019 Acute sinusitis, unspecified: Secondary | ICD-10-CM | POA: Diagnosis not present

## 2021-09-28 DIAGNOSIS — E109 Type 1 diabetes mellitus without complications: Secondary | ICD-10-CM | POA: Diagnosis not present

## 2021-09-28 DIAGNOSIS — J22 Unspecified acute lower respiratory infection: Secondary | ICD-10-CM | POA: Diagnosis not present

## 2021-09-28 DIAGNOSIS — Z794 Long term (current) use of insulin: Secondary | ICD-10-CM | POA: Diagnosis not present

## 2021-09-28 DIAGNOSIS — Z9641 Presence of insulin pump (external) (internal): Secondary | ICD-10-CM | POA: Diagnosis not present

## 2021-10-02 DIAGNOSIS — R739 Hyperglycemia, unspecified: Secondary | ICD-10-CM | POA: Diagnosis not present

## 2021-10-02 DIAGNOSIS — Z79899 Other long term (current) drug therapy: Secondary | ICD-10-CM | POA: Diagnosis not present

## 2021-10-02 DIAGNOSIS — I959 Hypotension, unspecified: Secondary | ICD-10-CM | POA: Diagnosis not present

## 2021-10-02 DIAGNOSIS — R42 Dizziness and giddiness: Secondary | ICD-10-CM | POA: Diagnosis not present

## 2021-10-02 DIAGNOSIS — S01112A Laceration without foreign body of left eyelid and periocular area, initial encounter: Secondary | ICD-10-CM | POA: Diagnosis not present

## 2021-10-02 DIAGNOSIS — W19XXXA Unspecified fall, initial encounter: Secondary | ICD-10-CM | POA: Diagnosis not present

## 2021-10-02 DIAGNOSIS — E1010 Type 1 diabetes mellitus with ketoacidosis without coma: Secondary | ICD-10-CM | POA: Diagnosis not present

## 2021-10-02 DIAGNOSIS — S0990XA Unspecified injury of head, initial encounter: Secondary | ICD-10-CM | POA: Diagnosis not present

## 2021-10-02 DIAGNOSIS — R55 Syncope and collapse: Secondary | ICD-10-CM | POA: Diagnosis not present

## 2021-10-02 DIAGNOSIS — S0181XA Laceration without foreign body of other part of head, initial encounter: Secondary | ICD-10-CM | POA: Diagnosis not present

## 2021-10-02 DIAGNOSIS — Z7982 Long term (current) use of aspirin: Secondary | ICD-10-CM | POA: Diagnosis not present

## 2021-10-02 DIAGNOSIS — Z794 Long term (current) use of insulin: Secondary | ICD-10-CM | POA: Diagnosis not present

## 2021-10-02 DIAGNOSIS — R7989 Other specified abnormal findings of blood chemistry: Secondary | ICD-10-CM | POA: Diagnosis not present

## 2021-10-02 DIAGNOSIS — Z9181 History of falling: Secondary | ICD-10-CM | POA: Diagnosis not present

## 2021-10-03 DIAGNOSIS — E1010 Type 1 diabetes mellitus with ketoacidosis without coma: Secondary | ICD-10-CM | POA: Diagnosis not present

## 2021-10-03 DIAGNOSIS — R7989 Other specified abnormal findings of blood chemistry: Secondary | ICD-10-CM | POA: Diagnosis not present

## 2021-10-03 DIAGNOSIS — W19XXXA Unspecified fall, initial encounter: Secondary | ICD-10-CM | POA: Diagnosis not present

## 2021-10-07 DIAGNOSIS — E663 Overweight: Secondary | ICD-10-CM | POA: Diagnosis not present

## 2021-10-07 DIAGNOSIS — S0181XA Laceration without foreign body of other part of head, initial encounter: Secondary | ICD-10-CM | POA: Diagnosis not present

## 2021-10-07 DIAGNOSIS — E875 Hyperkalemia: Secondary | ICD-10-CM | POA: Diagnosis not present

## 2021-10-07 DIAGNOSIS — E871 Hypo-osmolality and hyponatremia: Secondary | ICD-10-CM | POA: Diagnosis not present

## 2021-10-07 DIAGNOSIS — Z6824 Body mass index (BMI) 24.0-24.9, adult: Secondary | ICD-10-CM | POA: Diagnosis not present

## 2021-10-07 DIAGNOSIS — R55 Syncope and collapse: Secondary | ICD-10-CM | POA: Diagnosis not present

## 2021-10-07 DIAGNOSIS — E109 Type 1 diabetes mellitus without complications: Secondary | ICD-10-CM | POA: Diagnosis not present

## 2021-10-08 DIAGNOSIS — E109 Type 1 diabetes mellitus without complications: Secondary | ICD-10-CM | POA: Diagnosis not present

## 2021-10-08 DIAGNOSIS — Z9641 Presence of insulin pump (external) (internal): Secondary | ICD-10-CM | POA: Diagnosis not present

## 2021-10-10 DIAGNOSIS — E1065 Type 1 diabetes mellitus with hyperglycemia: Secondary | ICD-10-CM | POA: Diagnosis not present

## 2021-10-12 DIAGNOSIS — S0181XA Laceration without foreign body of other part of head, initial encounter: Secondary | ICD-10-CM | POA: Diagnosis not present

## 2021-10-12 DIAGNOSIS — Z1331 Encounter for screening for depression: Secondary | ICD-10-CM | POA: Diagnosis not present

## 2021-10-12 DIAGNOSIS — Z6824 Body mass index (BMI) 24.0-24.9, adult: Secondary | ICD-10-CM | POA: Diagnosis not present

## 2021-10-12 DIAGNOSIS — E663 Overweight: Secondary | ICD-10-CM | POA: Diagnosis not present

## 2021-10-15 DIAGNOSIS — Z794 Long term (current) use of insulin: Secondary | ICD-10-CM | POA: Diagnosis not present

## 2021-10-15 DIAGNOSIS — Z9641 Presence of insulin pump (external) (internal): Secondary | ICD-10-CM | POA: Diagnosis not present

## 2021-10-15 DIAGNOSIS — E109 Type 1 diabetes mellitus without complications: Secondary | ICD-10-CM | POA: Diagnosis not present

## 2021-10-27 DIAGNOSIS — E1142 Type 2 diabetes mellitus with diabetic polyneuropathy: Secondary | ICD-10-CM | POA: Diagnosis not present

## 2021-10-27 DIAGNOSIS — B351 Tinea unguium: Secondary | ICD-10-CM | POA: Diagnosis not present

## 2021-10-27 DIAGNOSIS — L84 Corns and callosities: Secondary | ICD-10-CM | POA: Diagnosis not present

## 2021-11-10 DIAGNOSIS — E1065 Type 1 diabetes mellitus with hyperglycemia: Secondary | ICD-10-CM | POA: Diagnosis not present

## 2021-12-10 DIAGNOSIS — E1065 Type 1 diabetes mellitus with hyperglycemia: Secondary | ICD-10-CM | POA: Diagnosis not present

## 2021-12-17 DIAGNOSIS — E7800 Pure hypercholesterolemia, unspecified: Secondary | ICD-10-CM | POA: Diagnosis not present

## 2021-12-17 DIAGNOSIS — R55 Syncope and collapse: Secondary | ICD-10-CM | POA: Diagnosis not present

## 2021-12-17 DIAGNOSIS — E871 Hypo-osmolality and hyponatremia: Secondary | ICD-10-CM | POA: Diagnosis not present

## 2021-12-17 DIAGNOSIS — E109 Type 1 diabetes mellitus without complications: Secondary | ICD-10-CM | POA: Diagnosis not present

## 2021-12-17 DIAGNOSIS — I10 Essential (primary) hypertension: Secondary | ICD-10-CM | POA: Diagnosis not present

## 2021-12-17 DIAGNOSIS — Z9641 Presence of insulin pump (external) (internal): Secondary | ICD-10-CM | POA: Diagnosis not present

## 2021-12-17 DIAGNOSIS — R413 Other amnesia: Secondary | ICD-10-CM | POA: Diagnosis not present

## 2021-12-23 DIAGNOSIS — H40013 Open angle with borderline findings, low risk, bilateral: Secondary | ICD-10-CM | POA: Diagnosis not present

## 2021-12-23 DIAGNOSIS — E119 Type 2 diabetes mellitus without complications: Secondary | ICD-10-CM | POA: Diagnosis not present

## 2021-12-23 DIAGNOSIS — H2513 Age-related nuclear cataract, bilateral: Secondary | ICD-10-CM | POA: Diagnosis not present

## 2022-01-04 DIAGNOSIS — R55 Syncope and collapse: Secondary | ICD-10-CM | POA: Diagnosis not present

## 2022-01-04 DIAGNOSIS — E109 Type 1 diabetes mellitus without complications: Secondary | ICD-10-CM | POA: Diagnosis not present

## 2022-01-04 DIAGNOSIS — Z6824 Body mass index (BMI) 24.0-24.9, adult: Secondary | ICD-10-CM | POA: Diagnosis not present

## 2022-01-04 DIAGNOSIS — E871 Hypo-osmolality and hyponatremia: Secondary | ICD-10-CM | POA: Diagnosis not present

## 2022-01-07 DIAGNOSIS — E1142 Type 2 diabetes mellitus with diabetic polyneuropathy: Secondary | ICD-10-CM | POA: Diagnosis not present

## 2022-01-07 DIAGNOSIS — B351 Tinea unguium: Secondary | ICD-10-CM | POA: Diagnosis not present

## 2022-01-10 DIAGNOSIS — E1065 Type 1 diabetes mellitus with hyperglycemia: Secondary | ICD-10-CM | POA: Diagnosis not present

## 2022-01-14 DIAGNOSIS — E2749 Other adrenocortical insufficiency: Secondary | ICD-10-CM | POA: Diagnosis not present

## 2022-01-14 DIAGNOSIS — E871 Hypo-osmolality and hyponatremia: Secondary | ICD-10-CM | POA: Diagnosis not present

## 2022-02-09 DIAGNOSIS — E1065 Type 1 diabetes mellitus with hyperglycemia: Secondary | ICD-10-CM | POA: Diagnosis not present

## 2022-03-05 DIAGNOSIS — E871 Hypo-osmolality and hyponatremia: Secondary | ICD-10-CM | POA: Diagnosis not present

## 2022-03-05 DIAGNOSIS — E119 Type 2 diabetes mellitus without complications: Secondary | ICD-10-CM | POA: Diagnosis not present

## 2022-03-05 DIAGNOSIS — Z6824 Body mass index (BMI) 24.0-24.9, adult: Secondary | ICD-10-CM | POA: Diagnosis not present

## 2022-03-05 DIAGNOSIS — I10 Essential (primary) hypertension: Secondary | ICD-10-CM | POA: Diagnosis not present

## 2022-03-05 DIAGNOSIS — R55 Syncope and collapse: Secondary | ICD-10-CM | POA: Diagnosis not present

## 2022-03-05 DIAGNOSIS — E782 Mixed hyperlipidemia: Secondary | ICD-10-CM | POA: Diagnosis not present

## 2022-03-05 DIAGNOSIS — E2749 Other adrenocortical insufficiency: Secondary | ICD-10-CM | POA: Diagnosis not present

## 2022-03-05 DIAGNOSIS — E1010 Type 1 diabetes mellitus with ketoacidosis without coma: Secondary | ICD-10-CM | POA: Diagnosis not present

## 2022-03-05 DIAGNOSIS — E162 Hypoglycemia, unspecified: Secondary | ICD-10-CM | POA: Diagnosis not present

## 2022-03-12 DIAGNOSIS — E1065 Type 1 diabetes mellitus with hyperglycemia: Secondary | ICD-10-CM | POA: Diagnosis not present

## 2022-03-17 DIAGNOSIS — E109 Type 1 diabetes mellitus without complications: Secondary | ICD-10-CM | POA: Diagnosis not present

## 2022-03-17 DIAGNOSIS — E7800 Pure hypercholesterolemia, unspecified: Secondary | ICD-10-CM | POA: Diagnosis not present

## 2022-03-17 DIAGNOSIS — I10 Essential (primary) hypertension: Secondary | ICD-10-CM | POA: Diagnosis not present

## 2022-03-17 DIAGNOSIS — E871 Hypo-osmolality and hyponatremia: Secondary | ICD-10-CM | POA: Diagnosis not present

## 2022-03-17 DIAGNOSIS — Z9641 Presence of insulin pump (external) (internal): Secondary | ICD-10-CM | POA: Diagnosis not present

## 2022-03-19 DIAGNOSIS — E109 Type 1 diabetes mellitus without complications: Secondary | ICD-10-CM | POA: Diagnosis not present

## 2022-03-23 ENCOUNTER — Other Ambulatory Visit: Payer: Self-pay | Admitting: *Deleted

## 2022-03-23 DIAGNOSIS — Z794 Long term (current) use of insulin: Secondary | ICD-10-CM | POA: Diagnosis not present

## 2022-03-23 DIAGNOSIS — Z9641 Presence of insulin pump (external) (internal): Secondary | ICD-10-CM | POA: Diagnosis not present

## 2022-03-23 DIAGNOSIS — E109 Type 1 diabetes mellitus without complications: Secondary | ICD-10-CM | POA: Diagnosis not present

## 2022-03-23 NOTE — Patient Outreach (Signed)
  Care Coordination   03/23/2022  Name: Scott Garner MRN: [id number] DOB: Sep 10, 1945   Care Coordination Outreach Attempts:  An unsuccessful telephone outreach was attempted today to offer the patient information about available care coordination services as a benefit of their health plan. HIPAA compliant message left on voicemail, providing contact information for CSW, encouraging patient to return CSW's call at his earliest convenience.  Follow Up Plan:  Additional outreach attempts will be made to offer the patient care coordination information and services.   Encounter Outcome:  No Answer.   Care Coordination Interventions Activated:  No.    Care Coordination Interventions:  No, not indicated.    Danford Bad, BSW, MSW, LCSW  Licensed Restaurant manager, [employer] [facility]  Mailing [place] [address], [place], [place] [address] Physical Address-[address] [place], [place] [address] Toll Free Main # [phone] Fax # [phone] Cell # [phone] [email or web address]

## 2022-03-25 DIAGNOSIS — B351 Tinea unguium: Secondary | ICD-10-CM | POA: Diagnosis not present

## 2022-03-25 DIAGNOSIS — E1142 Type 2 diabetes mellitus with diabetic polyneuropathy: Secondary | ICD-10-CM | POA: Diagnosis not present

## 2022-03-29 ENCOUNTER — Telehealth: Payer: Self-pay | Admitting: *Deleted

## 2022-03-29 NOTE — Patient Outreach (Signed)
  Care Coordination   03/29/2022 Name: Scott Garner MRN: [id number] DOB: 1946-04-19   Care Coordination Outreach Attempts:  A second unsuccessful outreach was attempted today to offer the patient with information about available care coordination services as a benefit of their health plan.     Follow Up Plan:  Additional outreach attempts will be made to offer the patient care coordination information and services.   Encounter Outcome:  No Answer  Care Coordination Interventions Activated:  No   Care Coordination Interventions:  No, not indicated    Demetrios Loll, BSN, RN-BC RN Care Coordinator Direct Dial: [phone]

## 2022-04-02 ENCOUNTER — Telehealth: Payer: Self-pay | Admitting: *Deleted

## 2022-04-02 NOTE — Patient Outreach (Signed)
  Care Coordination   04/02/2022 Name: Scott Garner MRN: [id number] DOB: 05-23-1946   Care Coordination Outreach Attempts:  A third unsuccessful outreach was attempted today to offer the patient with information about available care coordination services as a benefit of their health plan.   Follow Up Plan:  No further outreach attempts will be made at this time. We have been unable to contact the patient to offer or enroll patient in care coordination services  Encounter Outcome:  No Answer  Care Coordination Interventions Activated:  No   Care Coordination Interventions:  No, not indicated    Kemper Durie, RN, MSN, [facility] Care Management Care Management Coordinator [phone]

## 2022-04-12 DIAGNOSIS — E1065 Type 1 diabetes mellitus with hyperglycemia: Secondary | ICD-10-CM | POA: Diagnosis not present

## 2022-04-23 DIAGNOSIS — Z794 Long term (current) use of insulin: Secondary | ICD-10-CM | POA: Diagnosis not present

## 2022-04-23 DIAGNOSIS — E109 Type 1 diabetes mellitus without complications: Secondary | ICD-10-CM | POA: Diagnosis not present

## 2022-04-23 DIAGNOSIS — Z9641 Presence of insulin pump (external) (internal): Secondary | ICD-10-CM | POA: Diagnosis not present

## 2022-05-03 ENCOUNTER — Other Ambulatory Visit: Payer: Self-pay

## 2022-05-03 ENCOUNTER — Emergency Department (HOSPITAL_COMMUNITY): Payer: PPO

## 2022-05-03 ENCOUNTER — Emergency Department (HOSPITAL_COMMUNITY)
Admission: EM | Admit: 2022-05-03 | Discharge: 2022-05-03 | Disposition: A | Discharge status: Home / Self Care | Payer: PPO | Attending: Emergency Medicine | Admitting: Emergency Medicine

## 2022-05-03 ENCOUNTER — Encounter (HOSPITAL_COMMUNITY): Payer: Self-pay

## 2022-05-03 DIAGNOSIS — X58XXXA Exposure to other specified factors, initial encounter: Secondary | ICD-10-CM | POA: Diagnosis not present

## 2022-05-03 DIAGNOSIS — G319 Degenerative disease of nervous system, unspecified: Secondary | ICD-10-CM | POA: Diagnosis not present

## 2022-05-03 DIAGNOSIS — E871 Hypo-osmolality and hyponatremia: Secondary | ICD-10-CM | POA: Diagnosis not present

## 2022-05-03 DIAGNOSIS — Z79899 Other long term (current) drug therapy: Secondary | ICD-10-CM | POA: Diagnosis not present

## 2022-05-03 DIAGNOSIS — I639 Cerebral infarction, unspecified: Secondary | ICD-10-CM | POA: Diagnosis not present

## 2022-05-03 DIAGNOSIS — Z20822 Contact with and (suspected) exposure to covid-19: Secondary | ICD-10-CM | POA: Diagnosis not present

## 2022-05-03 DIAGNOSIS — E109 Type 1 diabetes mellitus without complications: Secondary | ICD-10-CM | POA: Diagnosis not present

## 2022-05-03 DIAGNOSIS — R410 Disorientation, unspecified: Secondary | ICD-10-CM | POA: Insufficient documentation

## 2022-05-03 DIAGNOSIS — I10 Essential (primary) hypertension: Secondary | ICD-10-CM | POA: Diagnosis not present

## 2022-05-03 DIAGNOSIS — R4182 Altered mental status, unspecified: Secondary | ICD-10-CM | POA: Diagnosis not present

## 2022-05-03 DIAGNOSIS — S59911A Unspecified injury of right forearm, initial encounter: Secondary | ICD-10-CM | POA: Diagnosis not present

## 2022-05-03 DIAGNOSIS — Z7982 Long term (current) use of aspirin: Secondary | ICD-10-CM | POA: Insufficient documentation

## 2022-05-03 DIAGNOSIS — S51811A Laceration without foreign body of right forearm, initial encounter: Secondary | ICD-10-CM | POA: Diagnosis not present

## 2022-05-03 DIAGNOSIS — G9389 Other specified disorders of brain: Secondary | ICD-10-CM | POA: Diagnosis not present

## 2022-05-03 DIAGNOSIS — R531 Weakness: Secondary | ICD-10-CM | POA: Diagnosis not present

## 2022-05-03 LAB — URINALYSIS, ROUTINE W REFLEX MICROSCOPIC
Bilirubin Urine: NEGATIVE
Glucose, UA: NEGATIVE mg/dL
Hgb urine dipstick: NEGATIVE
Ketones, ur: 5 mg/dL — AB
Leukocytes,Ua: NEGATIVE
Nitrite: NEGATIVE
Protein, ur: NEGATIVE mg/dL
Specific Gravity, Urine: 1.006 (ref 1.005–1.030)
pH: 7 (ref 5.0–8.0)

## 2022-05-03 LAB — RESP PANEL BY RT-PCR (FLU A&B, COVID) ARPGX2
Influenza A by PCR: NEGATIVE
Influenza B by PCR: NEGATIVE
SARS Coronavirus 2 by RT PCR: NEGATIVE

## 2022-05-03 LAB — COMPREHENSIVE METABOLIC PANEL
ALT: 21 U/L (ref 0–44)
AST: 25 U/L (ref 15–41)
Albumin: 4 g/dL (ref 3.5–5.0)
Alkaline Phosphatase: 60 U/L (ref 38–126)
Anion gap: 7 (ref 5–15)
BUN: 16 mg/dL (ref 8–23)
CO2: 24 mmol/L (ref 22–32)
Calcium: 8.2 mg/dL — ABNORMAL LOW (ref 8.9–10.3)
Chloride: 93 mmol/L — ABNORMAL LOW (ref 98–111)
Creatinine, Ser: 0.93 mg/dL (ref 0.61–1.24)
GFR, Estimated: >60 mL/min (ref >60)
Glucose, Bld: 156 mg/dL — ABNORMAL HIGH (ref 70–99)
Potassium: 4.2 mmol/L (ref 3.5–5.1)
Sodium: 124 mmol/L — ABNORMAL LOW (ref 135–145)
Total Bilirubin: 1 mg/dL (ref 0.3–1.2)
Total Protein: 6.5 g/dL (ref 6.5–8.1)

## 2022-05-03 LAB — BLOOD GAS, VENOUS
Acid-Base Excess: 2.4 mmol/L — ABNORMAL HIGH (ref 0.0–2.0)
Bicarbonate: 28.4 mmol/L — ABNORMAL HIGH (ref 20.0–28.0)
Drawn by: 6892
O2 Saturation: 29 %
Patient temperature: 37
pCO2, Ven: 48 mmHg (ref 44–60)
pH, Ven: 7.38 (ref 7.25–7.43)
pO2, Ven: <31 mmHg — CL (ref 32–45)

## 2022-05-03 LAB — CBC WITH DIFFERENTIAL/PLATELET
Abs Immature Granulocytes: 0.03 10*3/uL (ref 0.00–0.07)
Basophils Absolute: 0.1 10*3/uL (ref 0.0–0.1)
Basophils Relative: 1 %
Eosinophils Absolute: 0.1 10*3/uL (ref 0.0–0.5)
Eosinophils Relative: 1 %
HCT: 41.1 % (ref 39.0–52.0)
Hemoglobin: 14.3 g/dL (ref 13.0–17.0)
Immature Granulocytes: 0 %
Lymphocytes Relative: 12 %
Lymphs Abs: 1.1 10*3/uL (ref 0.7–4.0)
MCH: 31 pg (ref 26.0–34.0)
MCHC: 34.8 g/dL (ref 30.0–36.0)
MCV: 89 fL (ref 80.0–100.0)
Monocytes Absolute: 0.7 10*3/uL (ref 0.1–1.0)
Monocytes Relative: 7 %
Neutro Abs: 7.5 10*3/uL (ref 1.7–7.7)
Neutrophils Relative %: 79 %
Platelets: 276 10*3/uL (ref 150–400)
RBC: 4.62 MIL/uL (ref 4.22–5.81)
RDW: 13.5 % (ref 11.5–15.5)
WBC: 9.4 10*3/uL (ref 4.0–10.5)
nRBC: 0 % (ref 0.0–0.2)

## 2022-05-03 LAB — CBG MONITORING, ED: Glucose-Capillary: 148 mg/dL — ABNORMAL HIGH (ref 70–99)

## 2022-05-03 LAB — TROPONIN I (HIGH SENSITIVITY): Troponin I (High Sensitivity): 5 ng/L (ref <18)

## 2022-05-03 NOTE — Discharge Instructions (Addendum)
You were seen after a confused episode at home but were normal on exam today here.  As we discussed, your sodium levels were slightly low but appear to be consistent with your normal.  Try drinking soup once a week as you discussed with your PCP.  I have also put in a referral to neurology.  Call the phone number in your discharge paperwork to follow-up with them regarding his episode today.  He did not have any evidence of stroke or head bleed on his MRI.  Come back for any recurrent episodes, chest pain, shortness of breath, weakness in the arms or legs, or any other symptoms concerning to you.  Make sure to keep your arm tears clean.  There were no fractures on your x-ray.  Clean with soap and water 2 times daily and dressed with a clean bandage.

## 2022-05-03 NOTE — ED Provider Notes (Signed)
[facility] EMERGENCY DEPARTMENT Provider Note   CSN: [id number] Arrival date & time: 05/03/22  1625     History  Chief Complaint  Patient presents with   Altered Mental Status    Scott Garner is a 76 y.o. male.  With PMH of DM1, HTN, HLD who presented from home today after episode of confusion, unresponsive episode with questionable left facial droop.  Patient's family said he was standing outside for about an hour under the hot sun watching his daughter and family members chop up wood when he began to feel unwell.  Her his daughter noted he began to stumble backwards and hit his right arm on the ground.  He did not hit his head but was confused and his eyes rolled back.  There is no shaking activity or loss of bladder or bowels.  He told me he felt like his head was getting swimmy from being too hot and standing out in the sun.  He denies any chest pain or shortness of breath at the time.  It took him a while to become oriented back to his surroundings but at the time when this happened he was confused about where he was or his address.  According to the family members EMS had noted some mild right-sided drift of his arm.  By time of his arrival to the ER, he was feeling back to baseline and family notes he is also back to his baseline with no confusion, no facial droop or other abnormalities.  He did sustain a skin tear to his right arm but denies any pain in the arm or hand.  No numbness or tingling.  Last tetanus last 5 years.  They note he has had a heatstroke in the past and this seems similar.   Altered Mental Status      Home Medications Prior to Admission medications   Medication Sig Start Date End Date Taking? Authorizing Provider  amLODipine (NORVASC) 5 MG tablet Take 5 mg by mouth daily.    [provider]  aspirin EC 81 MG tablet Take 81 mg by mouth daily.    [provider]  COD LIVER OIL PO Take 1 capsule by mouth daily.    [provider]   fish oil-omega-3 fatty acids 1000 MG capsule Take 1 g by mouth daily.    [provider]  insulin aspart (NOVOLOG) 100 UNIT/ML injection Inject into the skin continuous. Patient has an insulin pump.    [provider]  Insulin Syringe-Needle U-100 (INSULIN SYRINGE .3CC/30GX5/16") 30G X 5/16" 0.3 ML MISC Use as directed 07/23/21   Johnson, Clanford L, MD  lisinopril (ZESTRIL) 20 MG tablet Take 1 tablet (20 mg total) by mouth daily. 07/24/21   Johnson, Clanford L, MD  LUMIGAN 0.01 % SOLN Place 1 drop into both eyes at bedtime. 07/04/21   [provider]  Multiple Vitamin (MULTIVITAMIN WITH MINERALS) TABS Take 1 tablet by mouth daily.    [provider]  pravastatin (PRAVACHOL) 10 MG tablet Take 10 mg by mouth at bedtime.    [provider]      Allergies    Neosporin [bacitracin-polymyxin b]    Review of Systems   Review of Systems  Physical Exam Updated Vital Signs BP 120/72 (BP Location: Left Arm)   Pulse 79   Temp 98.4 F (36.9 C) (Oral)   Resp 17   Ht 5\' 8"  (1.727 m)   Wt 71.2 kg   SpO2 99%  BMI 23.87 kg/m  Physical Exam Constitutional: Alert and oriented. Well appearing and in no distress. Eyes: Conjunctivae are normal. ENT      Head: Normocephalic and atraumatic.      Nose: No congestion.      Mouth/Throat: Mucous membranes are moist.      Neck: No stridor. Cardiovascular: S1, S2,  Normal and symmetric distal pulses are present in all extremities.Warm and well perfused. Respiratory: Normal respiratory effort. Breath sounds are normal.  O2 sat 99 on RA Gastrointestinal: Soft and nontender.  Musculoskeletal: Normal range of motion in all extremities. No pitting edema of the lower extremities. Neurologic: Normal speech and language without aphasia. AAOx4. PERRL. EOMI without nystagmus. Face symmetrical without droop. CN II-XII intact. Normal facial sensation. Tongue midline. No pronator drift. 5/5 strength in upper and lower  extremities. Normal sensation to light touch in all extremities.  No focal neurologic deficits are appreciated. Skin: Skin is warm, dry .  Right lateral forearm skin tears that are hemostatic.  Sensation intact distally with cap refill intact distally.  Full grip strength intact distally. Psychiatric: Mood and affect are normal. Speech and behavior are normal.  ED Results / Procedures / Treatments   Labs (all labs ordered are listed, but only abnormal results are displayed) Labs Reviewed  COMPREHENSIVE METABOLIC PANEL - Abnormal; Notable for the following components:      Result Value   Sodium 124 (*)    Chloride 93 (*)    Glucose, Bld 156 (*)    Calcium 8.2 (*)    All other components within normal limits  BLOOD GAS, VENOUS - Abnormal; Notable for the following components:   pO2, Ven <31 (*)    Bicarbonate 28.4 (*)    Acid-Base Excess 2.4 (*)    All other components within normal limits  URINALYSIS, ROUTINE W REFLEX MICROSCOPIC - Abnormal; Notable for the following components:   Color, Urine STRAW (*)    Ketones, ur 5 (*)    All other components within normal limits  CBG MONITORING, ED - Abnormal; Notable for the following components:   Glucose-Capillary 148 (*)    All other components within normal limits  RESP PANEL BY RT-PCR (FLU A&B, COVID) ARPGX2  CBC WITH DIFFERENTIAL/PLATELET  TROPONIN I (HIGH SENSITIVITY)    EKG EKG Interpretation  Date/Time:  Monday May 03 2022 17:30:52 EDT Ventricular Rate:  71 PR Interval:  182 QRS Duration: 97 QT Interval:  417 QTC Calculation: 454 R Axis:   96 Text Interpretation: Sinus rhythm Right axis deviation Baseline wander in lead(s) I Confirmed by Vivien Rossetti ([id number]) on 05/03/2022 5:33:59 PM  Radiology MR BRAIN WO CONTRAST  Result Date: 05/03/2022 CLINICAL DATA:  TIA symptoms EXAM: MRI HEAD WITHOUT CONTRAST TECHNIQUE: Multiplanar, multiecho pulse sequences of the brain and surrounding structures were obtained without  intravenous contrast. COMPARISON:  Head CT 08/23/2019 FINDINGS: Brain: No acute infarction, hemorrhage, hydrocephalus, extra-axial collection or mass lesion. Generalized brain atrophy. Innumerable foci of susceptibility artifact along the surface of the brain, usually chronic microhemorrhages. No calcifications by head CT. Chronic ischemic gliosis in the remaining deep white matter. Chronic lacune in the left pons and right thalamus. Brain atrophy is pronounced. Vascular: Major flow voids are preserved Skull and upper cervical spine: Normal marrow signal Sinuses/Orbits: Negative IMPRESSION: 1. No acute or reversible finding. 2. Brain atrophy with extensive chronic small vessel ischemia and signs of amyloid angiopathy. Electronically Signed   By: Tiburcio Pea M.D.   On: 05/03/2022 19:19   DG  Forearm Right  Result Date: 05/03/2022 CLINICAL DATA:  Right forearm injury. EXAM: RIGHT FOREARM - 2 VIEW COMPARISON:  None Available. FINDINGS: There is no evidence of fracture or other focal bone lesions. Soft tissues are unremarkable. IMPRESSION: Negative. Electronically Signed   By: Lupita Raider M.D.   On: 05/03/2022 18:31    Procedures Procedures  Remain on constant cardiac monitoring normal sinus rhythm.  Medications Ordered in ED Medications - No data to display  ED Course/ Medical Decision Making/ A&P Clinical Course as of 05/04/22 1023  Mon May 03, 2022  2041 Patient has remained at baseline mental status and ambulated with steady gait.  His lab work is notable for hyponatremia 124 which appears to be at baseline for patient and compared to last BMP obtained 9 months prior.  His MRI brain showed no acute CVA or ICH.  There was some evidence of possible amyloid angiopathy.  Referring patient to neurology.  Patient would like to go home and I discussed tricked return precautions with family.  He is going to drink soup once a week to help with hyponatremia which his PCP recommended to him.  Strict  return precaution discussed.  They are in agreement with plan.  He is safe for discharge. [VB]    Clinical Course User Index [VB] Mardene Sayer, MD                           Medical Decision Making  Scott Garner is a 76 y.o. male.  With PMH of DM1, HTN, HLD who presented from home today after episode of confusion, unresponsive episode with questionable left facial droop.   Stroke screen on arrival was negative.  Patient had abnormal episode of confusion and possible facial droop with possible syncopal episode.  There are multiple underlying etiologies including but not limited to TIA versus heat exhaustion versus acute electrolyte abnormalities versus cardiac arrhythmia versus atypical ACS although less likely with no active chest pain, pulmonary complaints, hypoglycemia or other acute electrolyte abnormalities vs infection such as UTI although less likely with no fever or infectious complaints.  Initial glucose on arrival 148.  He had no focal deficits on arrival to the ER.  An MRI of the brain was obtained with no obvious acute or reversible finding.  It does suggest possible evidence of amyloid angiopathy.  There was no ICH or CVA evidence.  His UA was obtained which showed no evidence of UTI.  EKG obtained which showed evidence of sinus rhythm with no acute ST/T changes.  High sensitive troponin 5 reassuring unlikely ACS.  As labs showed a hyponatremia of 124 which is consistent with his most recent labs 9 months prior.  He had no anemia hemoglobin 14.3.  Overall work-up only remarkable for mild hyponatremia which is consistent with patient's baseline.  He is back at baseline and would like to go home.  I have referred to neurology for findings of amyloid angiopathy on MRI and advise close follow-up with PCP.  Strict return precautions discussed.  Patient's family in agreement this plan.  Amount and/or Complexity of Data Reviewed Labs: ordered. Radiology: ordered.    Final  Clinical Impression(s) / ED Diagnoses Final diagnoses:  Confusion  Skin tear of right forearm without complication, initial encounter  Hyponatremia    Rx / DC Orders ED Discharge Orders          Ordered    Ambulatory referral to Neurology  Comments: An appointment is requested in approximately: 2 weeks   05/03/22 2041              Elgie Congo, MD 05/04/22 1023

## 2022-05-03 NOTE — ED Notes (Signed)
Pt sitting up in bed, pt states that he is ready to go home, dressing placed on R forearm per md instructions. Pt and pt's family verbalized understanding d/c and follow up, pt denies pain or dizziness, pt from dpt via wc with family

## 2022-05-03 NOTE — ED Triage Notes (Signed)
EMS reports pt from home.  Family noticed he was confused after coming in from outside around 1545 and face was drooped.  EMS says FD said one side of face was drooped but family said the other side was drooped.  No facial droop noted at this time.  Pt arrived with wounds to r forearm dressed by EMS.  Pt unsure of how the wounds got there. Reports pt confused.  Pt presently oriented to place and self, and time.  Denies any pain.  EDP assessed pt prior to triage.  CBG was 147 with EMS.  CBG 148 in ED.

## 2022-05-12 DIAGNOSIS — E1065 Type 1 diabetes mellitus with hyperglycemia: Secondary | ICD-10-CM | POA: Diagnosis not present

## 2022-05-24 DIAGNOSIS — Z794 Long term (current) use of insulin: Secondary | ICD-10-CM | POA: Diagnosis not present

## 2022-05-24 DIAGNOSIS — E109 Type 1 diabetes mellitus without complications: Secondary | ICD-10-CM | POA: Diagnosis not present

## 2022-05-24 DIAGNOSIS — Z9641 Presence of insulin pump (external) (internal): Secondary | ICD-10-CM | POA: Diagnosis not present

## 2022-05-25 ENCOUNTER — Encounter: Payer: Self-pay | Admitting: Neurology

## 2022-05-25 ENCOUNTER — Ambulatory Visit: Payer: PPO | Admitting: Neurology

## 2022-05-25 VITALS — BP 137/61 | HR 55 | Ht 69.0 in | Wt 160.0 lb

## 2022-05-25 DIAGNOSIS — E871 Hypo-osmolality and hyponatremia: Secondary | ICD-10-CM | POA: Diagnosis not present

## 2022-05-25 DIAGNOSIS — R55 Syncope and collapse: Secondary | ICD-10-CM

## 2022-05-25 DIAGNOSIS — I680 Cerebral amyloid angiopathy: Secondary | ICD-10-CM

## 2022-05-25 NOTE — Patient Instructions (Signed)
Continue current medications Increase fluid intake  Follow up in 3 months for memory evaluation  Return sooner if worse

## 2022-05-25 NOTE — Progress Notes (Signed)
[facility]  PATIENT: Scott Garner DOB: 03/11/1946  REQUESTING CLINICIAN: Elgie Congo, MD HISTORY FROM: Patient, spouse and chart review  REASON FOR VISIT: Syncope/Abnormal MRI    HISTORICAL  CHIEF COMPLAINT:  Chief Complaint  Patient presents with   New Patient (Initial Visit)    Rm 12. Accompanied by wife. NP internal referral for confusion, possible TIA.    HISTORY OF PRESENT ILLNESS:  This is a 76 year old gentleman past medical history of type 1 diabetes mellitus, hyperlipidemia, memory loss who is presenting after syncopal episode on October 2.  Wife reported on that day patient was outside in the sun, he suddenly felt swimmy headed then passed out.  Family helped patient to the house, EMS was called, initially he was confused but later regained full consciousness.  He was taken to the ED, MRI negative for any acute stroke but finding was suggestive of amyloid angiopathy for which he was referred here.  He was also found to be hyponatremic to 124.  Patient was observed in the ED with return to baseline and discharged home.  Per wife patient had multiple syncope in the past, in December 22 he had an insulin pump malfunction and his sugar was low for a long time.  He was in ICU for 2 days.  In March of this year also he had another pump malfunction and had a DKA again was admitted to the hospital.  In June he had a syncopal episode due to dehydration. September 6 had another syncopal episode due to dehydration and his last 1 was  on October second.  With all these episodes, patient reported feeling swimmy headed before passing out.  Denies any major injury from these syncope.   OTHER MEDICAL CONDITIONS: Type I Diabetes Mellitus, Hypertension, Hyperlipidemia, memory loss    REVIEW OF SYSTEMS: Full 14 system review of systems performed and negative with exception of: As noted in the HPI   ALLERGIES: Allergies  Allergen Reactions   Wound Dressing  Adhesive Rash   Neosporin [Bacitracin-Polymyxin B]     hives    HOME MEDICATIONS: Outpatient Medications Prior to Visit  Medication Sig Dispense Refill   amLODipine (NORVASC) 5 MG tablet Take 5 mg by mouth daily.     aspirin EC 81 MG tablet Take 81 mg by mouth daily.     COD LIVER OIL PO Take 1 capsule by mouth daily.     fish oil-omega-3 fatty acids 1000 MG capsule Take 1 g by mouth daily.     insulin aspart (NOVOLOG) 100 UNIT/ML injection Inject into the skin continuous. Patient has an insulin pump.     Insulin Syringe-Needle U-100 (INSULIN SYRINGE .3CC/30GX5/16") 30G X 5/16" 0.3 ML MISC Use as directed 10 each 1   lisinopril (ZESTRIL) 20 MG tablet Take 1 tablet (20 mg total) by mouth daily.     LUMIGAN 0.01 % SOLN Place 1 drop into both eyes at bedtime.     Multiple Vitamin (MULTIVITAMIN WITH MINERALS) TABS Take 1 tablet by mouth daily.     pravastatin (PRAVACHOL) 10 MG tablet Take 10 mg by mouth at bedtime.     No facility-administered medications prior to visit.    PAST MEDICAL HISTORY: Past Medical History:  Diagnosis Date   Diabetes mellitus without complication ([place])    Hypercholesteremia    Hypertension     PAST SURGICAL HISTORY: Past Surgical History:  Procedure Laterality Date   COLONOSCOPY     COLONOSCOPY N/A 10/24/2012   Procedure:  COLONOSCOPY;  Surgeon: Jamesetta So, MD;  [facility]: [facility];  Service: Gastroenterology;  Laterality: N/A;   HERNIA REPAIR     NASAL SINUS SURGERY      FAMILY HISTORY: Family History  Problem Relation Age of Onset   Colon cancer Brother     SOCIAL HISTORY: Social History   Socioeconomic History   Marital status: Married    Spouse name: Not on file   Number of children: Not on file   Years of education: Not on file   Highest education level: Not on file  Occupational History   Not on file  Tobacco Use   Smoking status: Never   Smokeless tobacco: Not on file  Vaping Use   Vaping Use: Never used  Substance and  Sexual Activity   Alcohol use: No   Drug use: No   Sexual activity: Not on file  Other Topics Concern   Not on file  Social History Narrative   Not on file   Social Determinants of Health   Financial Resource Strain: Not on file  Food Insecurity: Not on file  Transportation Needs: Not on file  Physical Activity: Not on file  Stress: Not on file  Social Connections: Not on file  Intimate Partner Violence: Not on file    PHYSICAL EXAM  GENERAL EXAM/CONSTITUTIONAL: Vitals:  Vitals:   05/25/22 1536  BP: 137/61  Pulse: (!) 55  Weight: 160 lb (72.6 kg)  Height: '5\' 9"'  (1.753 m)   Body mass index is 23.63 kg/m. Wt Readings from Last 3 Encounters:  05/25/22 160 lb (72.6 kg)  05/03/22 157 lb (71.2 kg)  07/24/21 156 lb 15.5 oz (71.2 kg)   Patient is in no distress; well developed, nourished and groomed; neck is supple  EYES: Pupils round and reactive to light, Visual fields full to confrontation, Extraocular movements intacts,   MUSCULOSKELETAL: Gait, strength, tone, movements noted in Neurologic exam below  NEUROLOGIC: MENTAL STATUS:      No data to display         awake, alert, oriented to person, place and time. Know the current president but not the previous, able to count the number of quarter in $1.75 language fluent, comprehension intact, naming intact   CRANIAL NERVE:  2nd, 3rd, 4th, 6th - pupils equal and reactive to light, visual fields full to confrontation, extraocular muscles intact, no nystagmus 5th - facial sensation symmetric 7th - facial strength symmetric 8th - hearing intact 9th - palate elevates symmetrically, uvula midline 11th - shoulder shrug symmetric 12th - tongue protrusion midline  MOTOR:  normal bulk and tone, full strength in the BUE, BLE  SENSORY:  normal and symmetric to light touch  COORDINATION:  finger-nose-finger, fine finger movements normal  GAIT/STATION:  normal   DIAGNOSTIC DATA (LABS, IMAGING, TESTING) - I  reviewed patient records, labs, notes, testing and imaging myself where available.  Lab Results  Component Value Date   WBC 9.4 05/03/2022   HGB 14.3 05/03/2022   HCT 41.1 05/03/2022   MCV 89.0 05/03/2022   PLT 276 05/03/2022      Component Value Date/Time   NA 124 (L) 05/03/2022 1749   K 4.2 05/03/2022 1749   CL 93 (L) 05/03/2022 1749   CO2 24 05/03/2022 1749   GLUCOSE 156 (H) 05/03/2022 1749   BUN 16 05/03/2022 1749   CREATININE 0.93 05/03/2022 1749   CALCIUM 8.2 (L) 05/03/2022 1749   PROT 6.5 05/03/2022 1749   ALBUMIN 4.0 05/03/2022 1749  AST 25 05/03/2022 1749   ALT 21 05/03/2022 1749   ALKPHOS 60 05/03/2022 1749   BILITOT 1.0 05/03/2022 1749   GFRNONAA >60 05/03/2022 1749   GFRAA  07/14/2009 1855    >60        The eGFR has been calculated using the MDRD equation. This calculation has not been validated in all clinical situations. eGFR's persistently <60 mL/min signify possible Chronic Kidney Disease.   No results found for: "CHOL", "HDL", "Isabella", "LDLDIRECT", "TRIG", "CHOLHDL" Lab Results  Component Value Date   HGBA1C 7.0 (H) 07/23/2021   No results found for: "VITAMINB12" Lab Results  Component Value Date   TSH 2.680 07/23/2021    MRI Brain 05/03/2022 1. No acute or reversible finding. 2. Brain atrophy with extensive chronic small vessel ischemia and signs of amyloid angiopathy   ASSESSMENT AND PLAN  76 y.o. year old male with history of diabetes mellitus type 2, hyperlipidemia, who is presenting after a syncopal episode on October 2nd.  Patient felt "swimmy headed" prior to passing out.  Patient was outside during the day in the hot sun.  Patient syncope likely vasovagal.  Advised him to increase his fluid intake, and also increasing his electrolyte and salt intake since his sodium was noted to be at 124. In terms of his findings on MRI consistent with amyloid angiopathy, patient is currently on aspirin 81 mg, denies taking any anticoagulation.  At  this time we will continue him on aspirin but will repeat MRI in about a year or 2.  This was discussed with patient and wife and they are comfortable with plans. Wife did have some concerns about patient memory that he is forgetful, he is on Prevagen and they agreed to come back in 44-month for a full memory evaluation.  Continue to follow with PCP and return sooner as if worse.   1. Cerebral amyloid angiopathy (CODE)   2. Syncope, unspecified syncope type   3. Hyponatremia      Patient Instructions  Continue current medications Increase fluid intake  Follow up in 3 months for memory evaluation  Return sooner if worse   No orders of the defined types were placed in this encounter.   No orders of the defined types were placed in this encounter.   Return in about 3 months (around 08/25/2022).  I have spent a total of 65 minutes dedicated to this patient today, preparing to see patient, performing a medically appropriate examination and evaluation, ordering tests and/or medications and procedures, and counseling and educating the patient/family/caregiver; independently interpreting result and communicating results to the family/patient/caregiver; and documenting clinical information in the electronic medical record.   AAlric Ran MD 05/25/2022, 10:07 PM  [facility] [address] [place] [phone]

## 2022-06-07 IMAGING — DX DG CHEST 1V PORT
1 series · 1 of 1 positions shown · non-contrast
Comparison: None.

CLINICAL DATA: Leukocytosis

EXAM:
PORTABLE CHEST 1 VIEW

[chest ap]
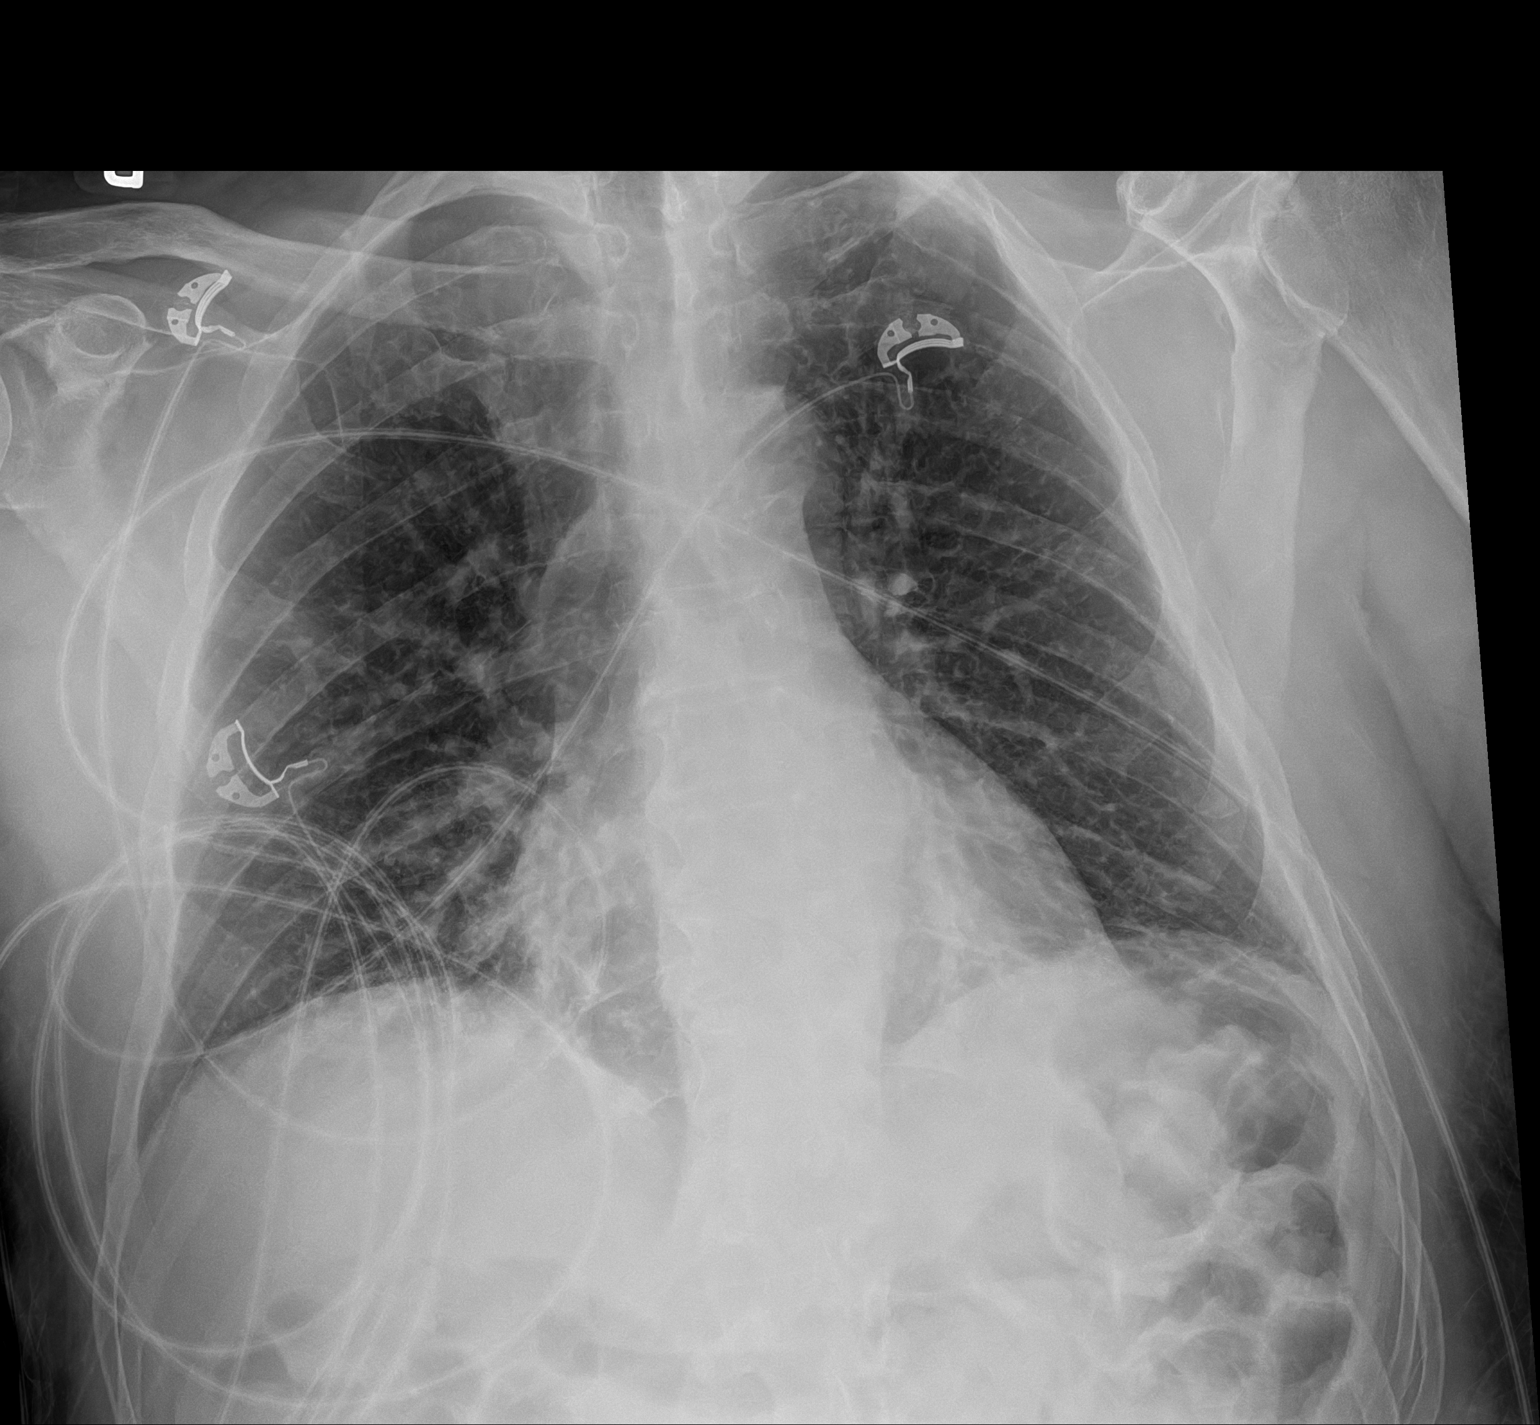

[1 of 1 positions shown; findings below may reference images not displayed]

FINDINGS: Heart size is normal. Mild aortic atherosclerotic calcification is
noted. Mild patchy density in both lower lobes could be atelectasis
or mild basilar pneumonia. Upper lungs are clear. No evidence of
heart failure or effusion. Chronic left clavicle fracture, possibly
with nonunion.
IMPRESSION: Patchy density at both lung bases that could be atelectasis or
atelectatic pneumonia.

## 2022-06-07 IMAGING — US US CAROTID DUPLEX BILAT
1 series · 13 of 24 positions shown · non-contrast
Comparison: None.

CLINICAL DATA: Syncope with collapse

EXAM:
BILATERAL CAROTID DUPLEX ULTRASOUND
TECHNIQUE: Gray scale imaging, color Doppler and duplex ultrasound were
performed of bilateral carotid and vertebral arteries in the neck.

[Series 1: us carotid bilateral · 13 of 69 slices shown]
[im 1/69]
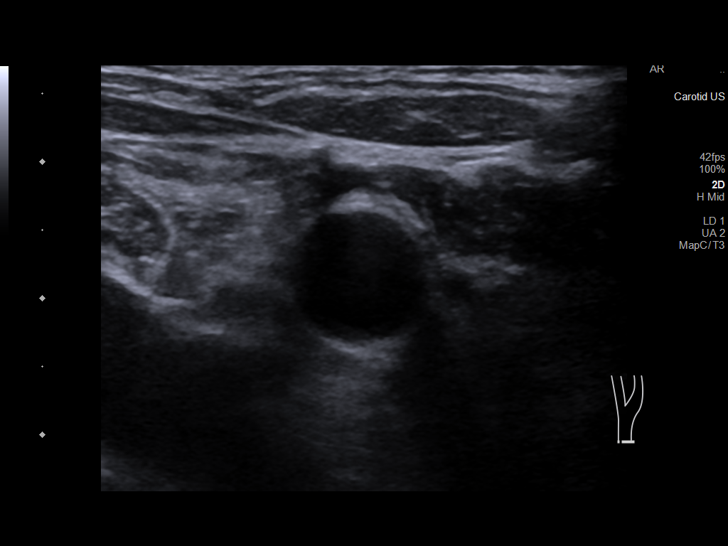
[im 6/69]
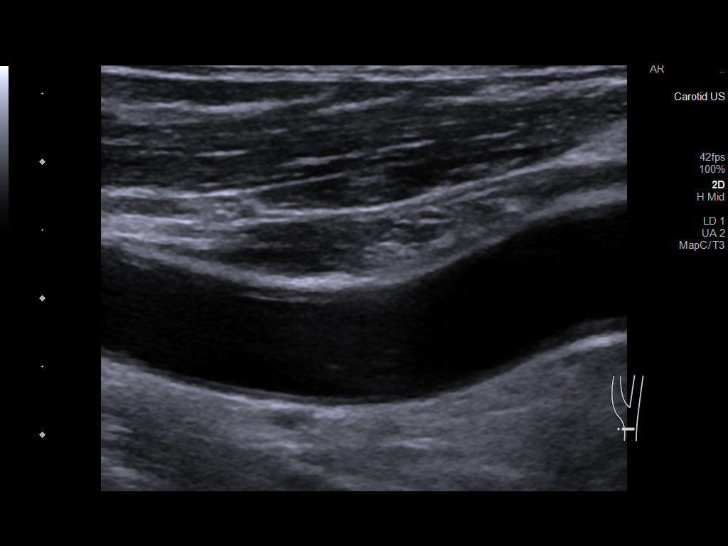
[im 12/69]
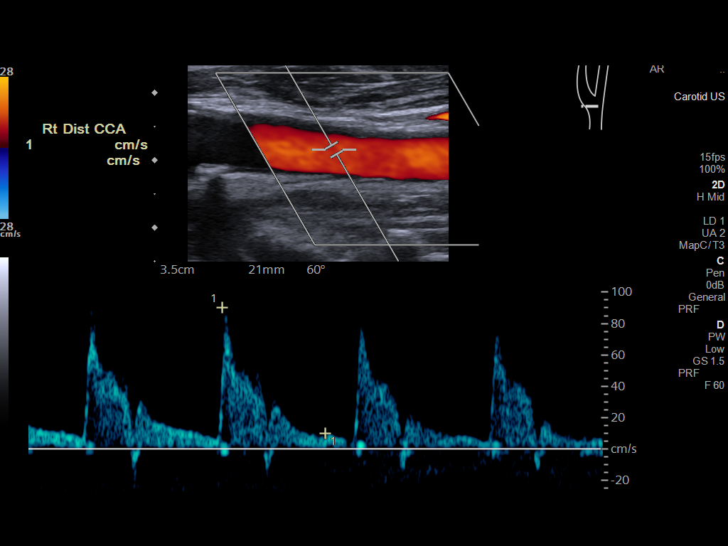
[im 18/69]
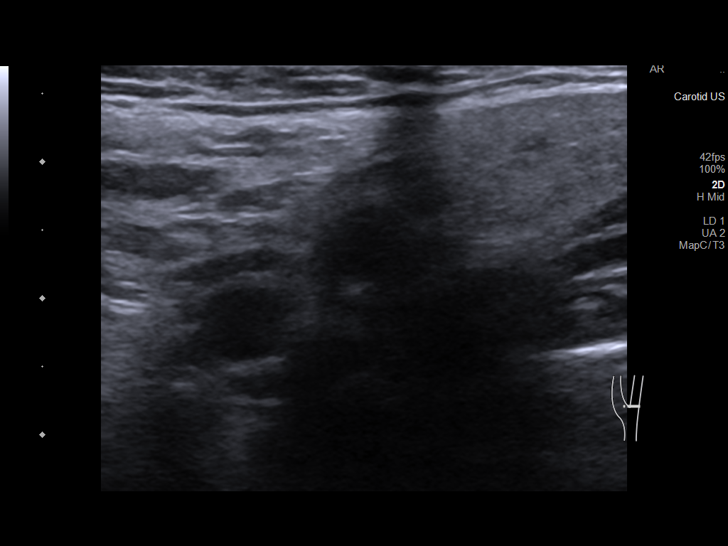
[im 24/69]
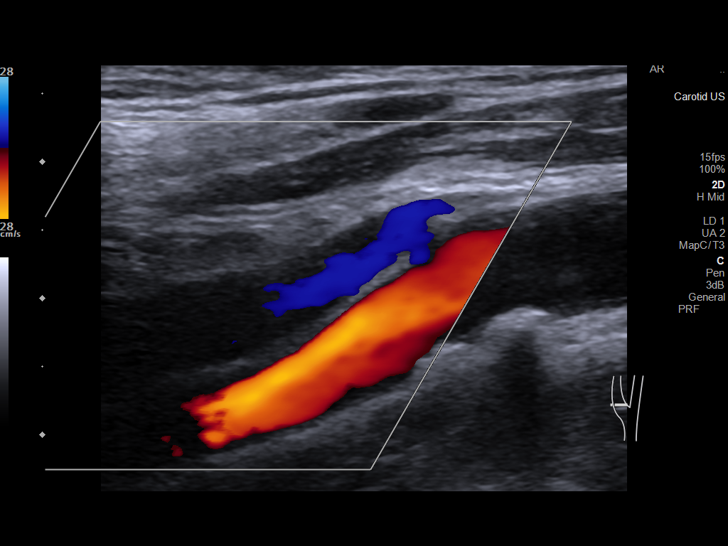
[im 30/69]
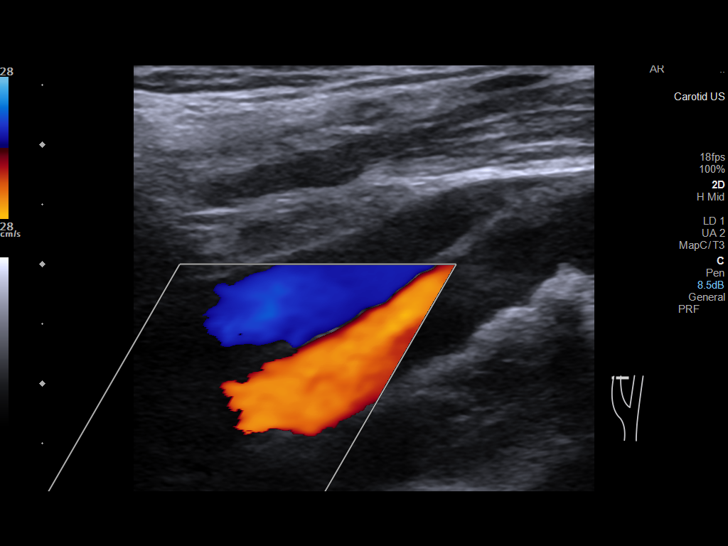
[im 36/69]
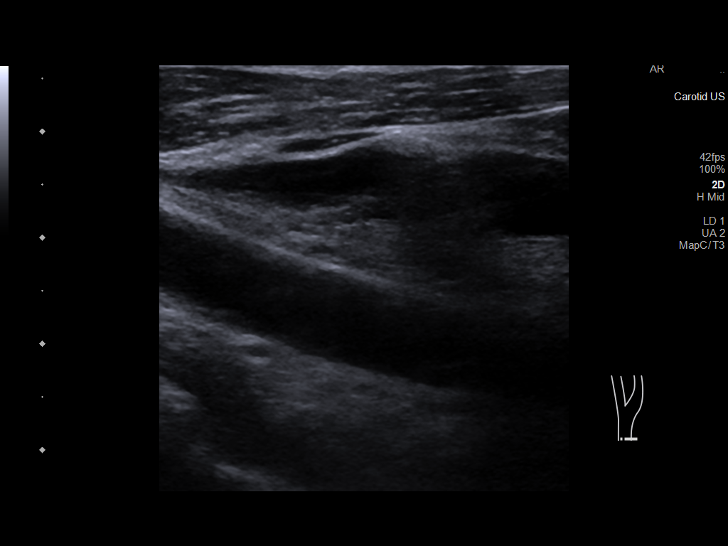
[im 39/69]
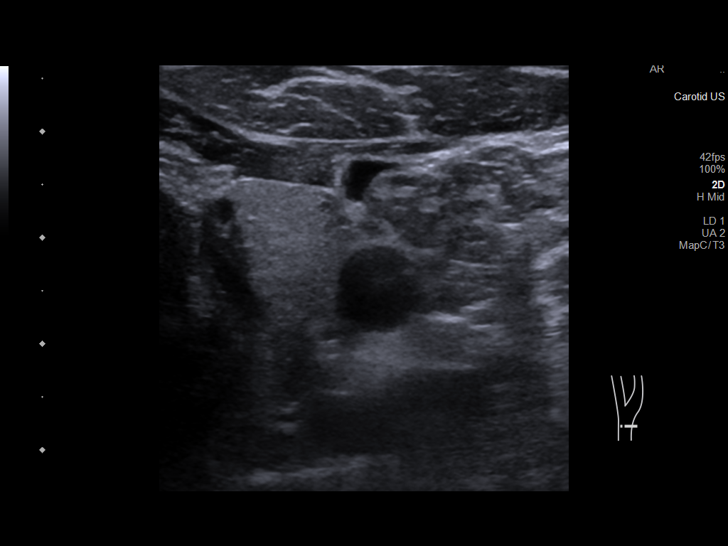
[im 45/69]
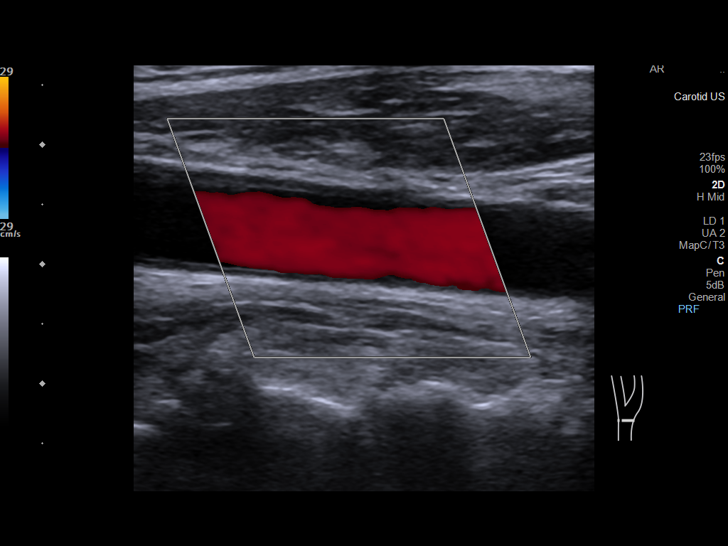
[im 51/69]
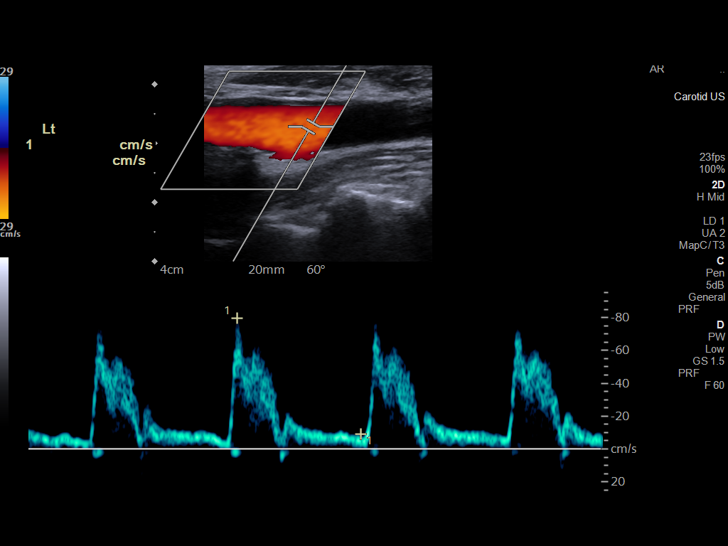
[im 57/69]
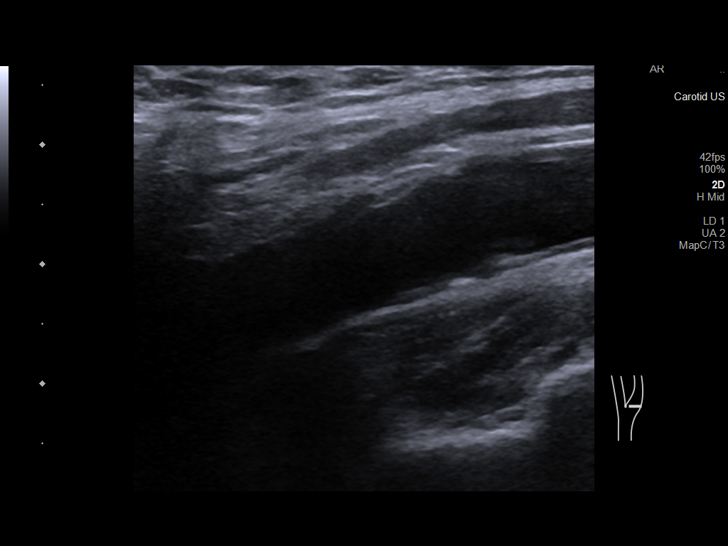
[im 63/69]
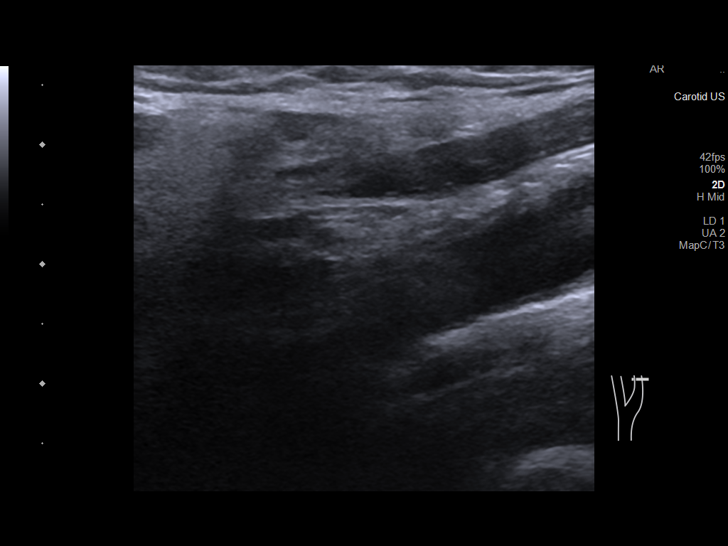
[im 69/69]
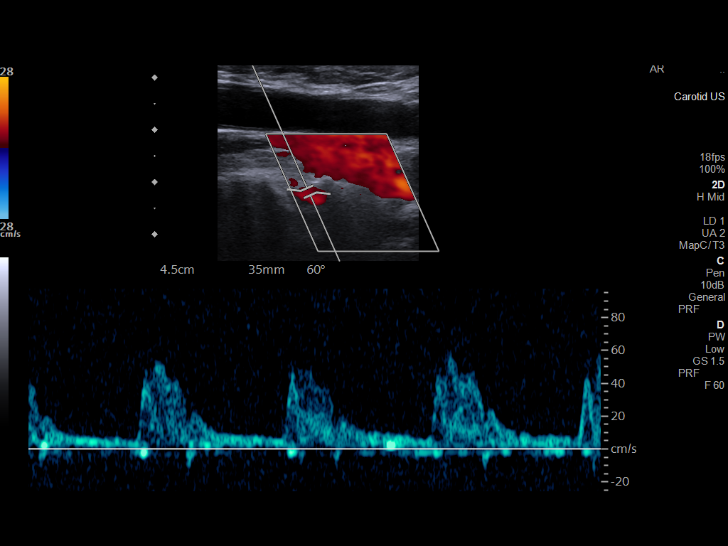

[13 of 24 positions shown; findings below may reference images not displayed]

FINDINGS: Criteria: Quantification of carotid stenosis is based on velocity
parameters that correlate the residual internal carotid diameter
with NASCET-based stenosis levels, using the diameter of the distal
internal carotid lumen as the denominator for stenosis measurement.

The following velocity measurements were obtained:

RIGHT
ICA: 88/20 cm/sec
CCA: 97/14 cm/sec

SYSTOLIC ICA/CCA RATIO:

ECA:  120 cm/sec

LEFT

ICA: 73/12 cm/sec

CCA: 91/10 cm/sec

SYSTOLIC ICA/CCA RATIO:

ECA:  118 cm/sec

RIGHT CAROTID ARTERY: Trace smooth heterogeneous atherosclerotic
plaque in the proximal internal carotid artery. By peak systolic
velocity criteria, the estimated stenosis is less than 50%.

RIGHT VERTEBRAL ARTERY:  Patent with normal antegrade flow.

LEFT CAROTID ARTERY: Heterogeneous atherosclerotic plaque in the
proximal internal carotid artery. By peak systolic velocity
criteria, the estimated stenosis is less than 50%.

LEFT VERTEBRAL ARTERY:  Patent with normal antegrade flow.
IMPRESSION: 1. Mild (1-49%) stenosis proximal right internal carotid artery
secondary to mild heterogeneous atherosclerotic plaque.
2. Mild (1-49%) stenosis proximal left internal carotid artery
secondary to heterogenous atherosclerotic plaque.
3. Vertebral arteries are patent with normal antegrade flow.

## 2022-06-10 DIAGNOSIS — B351 Tinea unguium: Secondary | ICD-10-CM | POA: Diagnosis not present

## 2022-06-10 DIAGNOSIS — E1142 Type 2 diabetes mellitus with diabetic polyneuropathy: Secondary | ICD-10-CM | POA: Diagnosis not present

## 2022-06-12 DIAGNOSIS — E1065 Type 1 diabetes mellitus with hyperglycemia: Secondary | ICD-10-CM | POA: Diagnosis not present

## 2022-06-16 DIAGNOSIS — E109 Type 1 diabetes mellitus without complications: Secondary | ICD-10-CM | POA: Diagnosis not present

## 2022-06-16 DIAGNOSIS — E871 Hypo-osmolality and hyponatremia: Secondary | ICD-10-CM | POA: Diagnosis not present

## 2022-06-16 DIAGNOSIS — E7800 Pure hypercholesterolemia, unspecified: Secondary | ICD-10-CM | POA: Diagnosis not present

## 2022-06-21 DIAGNOSIS — H40013 Open angle with borderline findings, low risk, bilateral: Secondary | ICD-10-CM | POA: Diagnosis not present

## 2022-06-21 DIAGNOSIS — H2513 Age-related nuclear cataract, bilateral: Secondary | ICD-10-CM | POA: Diagnosis not present

## 2022-06-21 DIAGNOSIS — E119 Type 2 diabetes mellitus without complications: Secondary | ICD-10-CM | POA: Diagnosis not present

## 2022-06-23 DIAGNOSIS — E871 Hypo-osmolality and hyponatremia: Secondary | ICD-10-CM | POA: Diagnosis not present

## 2022-06-23 DIAGNOSIS — E109 Type 1 diabetes mellitus without complications: Secondary | ICD-10-CM | POA: Diagnosis not present

## 2022-06-23 DIAGNOSIS — I10 Essential (primary) hypertension: Secondary | ICD-10-CM | POA: Diagnosis not present

## 2022-06-23 DIAGNOSIS — Z9641 Presence of insulin pump (external) (internal): Secondary | ICD-10-CM | POA: Diagnosis not present

## 2022-06-23 DIAGNOSIS — R55 Syncope and collapse: Secondary | ICD-10-CM | POA: Diagnosis not present

## 2022-06-23 DIAGNOSIS — E7800 Pure hypercholesterolemia, unspecified: Secondary | ICD-10-CM | POA: Diagnosis not present

## 2022-06-23 DIAGNOSIS — R413 Other amnesia: Secondary | ICD-10-CM | POA: Diagnosis not present

## 2022-06-25 DIAGNOSIS — Z9641 Presence of insulin pump (external) (internal): Secondary | ICD-10-CM | POA: Diagnosis not present

## 2022-06-25 DIAGNOSIS — E109 Type 1 diabetes mellitus without complications: Secondary | ICD-10-CM | POA: Diagnosis not present

## 2022-06-25 DIAGNOSIS — Z794 Long term (current) use of insulin: Secondary | ICD-10-CM | POA: Diagnosis not present

## 2022-06-29 NOTE — Progress Notes (Unsigned)
Cardiology Office Note:    Date:  07/01/2022   ID:  Scott, Garner Aug 27, 1945, MRN [id number]  PCP:  Assunta Found, MD   [facility] Providers Cardiologist:  None     Referring MD: Dorisann Frames, MD   Chief Complaint  Patient presents with   Follow-up   Loss of Consciousness    History of Present Illness:    Scott Garner is a 76 y.o. male is seen at the request of Dr Talmage Nap for evaluation of syncope. He has a history of DM type 2, HTN, HLD. Prior evaluation with carotid dopplers and Echo in Dec 2022 were unremarkable. Patient was admitted to the hospital in Dec 2022 with confusion, imbalance in setting of hypoglycemia with malfunction of his insulin pump. In May was admitted with DKA and again "passed out" at that time felt to be related to dehydration. In September he was outside in the hot sun and wife reports he passed out again. EMS called but patient refused to go to ED. In October was seen in ED - again outside in hot sun. Noted to stumble and hit right arm on ground. He was confused and not responding well. Evaluation in ED was OK. Vitals were stable. Labs only showed chronic hyponatremia. MRI done showing extensive small vessel disease and findings of amyloid angiopathy. No prior history of stroke. Seen by Neuro. Since then denies any recurrent dizziness, syncope, palpitations, chest pain or SOB. He has no real limitation in walking. Last A1c 6.2%.   Past Medical History:  Diagnosis Date   Diabetes mellitus without complication ([facility])    Hypercholesteremia    Hypertension     Past Surgical History:  Procedure Laterality Date   COLONOSCOPY     COLONOSCOPY N/A 10/24/2012   Procedure: COLONOSCOPY;  Surgeon: Dalia Heading, MD;  Location: [facility];  Service: Gastroenterology;  Laterality: N/A;   HERNIA REPAIR     NASAL SINUS SURGERY      Current Medications: Current Meds  Medication Sig   amLODipine (NORVASC) 5 MG tablet Take 5 mg by mouth daily.    aspirin EC 81 MG tablet Take 81 mg by mouth daily.   COD LIVER OIL PO Take 1 capsule by mouth daily.   fish oil-omega-3 fatty acids 1000 MG capsule Take 1 g by mouth daily.   insulin aspart (NOVOLOG) 100 UNIT/ML injection Inject into the skin continuous. Patient has an insulin pump.   Insulin Syringe-Needle U-100 (INSULIN SYRINGE .3CC/30GX5/16") 30G X 5/16" 0.3 ML MISC Use as directed   lisinopril (ZESTRIL) 20 MG tablet Take 1 tablet (20 mg total) by mouth daily.   LUMIGAN 0.01 % SOLN Place 1 drop into both eyes at bedtime.   Multiple Vitamin (MULTIVITAMIN WITH MINERALS) TABS Take 1 tablet by mouth daily.   pravastatin (PRAVACHOL) 10 MG tablet Take 10 mg by mouth at bedtime.     Allergies:   Wound dressing adhesive and Neosporin [bacitracin-polymyxin b]   Social History   Socioeconomic History   Marital status: Married    Spouse name: Not on file   Number of children: 1   Years of education: Not on file   Highest education level: Not on file  Occupational History   Not on file  Tobacco Use   Smoking status: Never   Smokeless tobacco: Not on file  Vaping Use   Vaping Use: Never used  Substance and Sexual Activity   Alcohol use: No   Drug use: No  Sexual activity: Not on file  Other Topics Concern   Not on file  Social History Narrative   Not on file   Social Determinants of Health   Financial Resource Strain: Not on file  Food Insecurity: Not on file  Transportation Needs: Not on file  Physical Activity: Not on file  Stress: Not on file  Social Connections: Not on file     Family History: The patient's family history includes Colon cancer in his brother; Stroke in his brother and father.  ROS:   Please see the history of present illness.     All other systems reviewed and are negative.  EKGs/Labs/Other Studies Reviewed:    The following studies were reviewed today: Echo 07/24/21: IMPRESSIONS     1. Left ventricular ejection fraction, by estimation, is 65 to  70%. The  left ventricle has normal function. The left ventricle has no regional  wall motion abnormalities. There is mild left ventricular hypertrophy.  Left ventricular diastolic parameters  are indeterminate.   2. Right ventricular systolic function is normal. The right ventricular  size is normal. There is normal pulmonary artery systolic pressure.   3. Mild mitral valve regurgitation.   4. The aortic valve is tricuspid. Aortic valve regurgitation is not  visualized. Aortic valve sclerosis/calcification is present, without any  evidence of aortic stenosis.   5. The inferior vena cava is normal in size with greater than 50%  respiratory variability, suggesting right atrial pressure of 3 mmHg.    EKG:  EKG is  ordered today.  The ekg ordered today demonstrates NSR rate 60. Normal. I have personally reviewed and interpreted this study.  EKG done 05/03/22 shows NSR rate 71. Right axis deviation. Otherwise normal. I have personally reviewed and interpreted this study.  Recent Labs: 07/23/2021: TSH 2.680 07/24/2021: Magnesium 1.7 05/03/2022: ALT 21; BUN 16; Creatinine, Ser 0.93; Hemoglobin 14.3; Platelets 276; Potassium 4.2; Sodium 124  Recent Lipid Panel No results found for: "CHOL", "TRIG", "HDL", "CHOLHDL", "VLDL", "LDLCALC", "LDLDIRECT"  Dated 03/18/21: cholesterol 205, triglycerides 27, HDL 110, LDL 68.  Dated 03/17/22: A1c 6.2%.    Risk Assessment/Calculations:          Physical Exam:    VS:  BP (!) 150/70 (BP Location: Right Arm, Patient Position: Sitting, Cuff Size: Normal)   Pulse 60   Ht 5\' 9"  (1.753 m)   Wt 160 lb (72.6 kg)   BMI 23.63 kg/m     Separate vitals obtained by me personally showed no orthostatic hypotension.  Wt Readings from Last 3 Encounters:  07/01/22 160 lb (72.6 kg)  05/25/22 160 lb (72.6 kg)  05/03/22 157 lb (71.2 kg)     GEN:  Well nourished, well developed in no acute distress HEENT: Normal NECK: No JVD; No carotid bruits LYMPHATICS: No  lymphadenopathy CARDIAC: RRR, no murmurs, rubs, gallops RESPIRATORY:  Clear to auscultation without rales, wheezing or rhonchi  ABDOMEN: Soft, non-tender, non-distended MUSCULOSKELETAL:  No edema; No deformity  SKIN: Warm and dry NEUROLOGIC:  Alert and oriented x 3 PSYCHIATRIC:  Normal affect   ASSESSMENT:    1. Syncope and collapse   2. Primary hypertension   3. Cerebral amyloid angiopathy (CODE)    PLAN:    In order of problems listed above:  Syncope. Based on history his episodes correlate with other issues such as hyper or hypoglycemia, dehydration, heat stress. No evidence of orthostasis. Normal Echo and carotid dopplers. No arrhythmia. At this point I don't feel any further cardiac work  up needed. Encouraged him to maintain good hydration. Curtail activities in extreme heat or cold.  Cerebral amyloid angiopathy based on MRI. No evidence of cardiac amyloid. I personally reviewed Echo from last Dec and no features of amyloid. Ecg voltage is normal. No orthostasis. No history of carpel tunnel. No further work up needed from a CV standpoint IDDM per endocrinology. HTN elevated today but generally under good control.           Medication Adjustments/Labs and Tests Ordered: Current medicines are reviewed at length with the patient today.  Concerns regarding medicines are outlined above.  No orders of the defined types were placed in this encounter.  No orders of the defined types were placed in this encounter.   There are no Patient Instructions on file for this visit.   Follow up PRN  Signed, Jenson Beedle [place], MD  07/01/2022 8:54 AM    [facility]

## 2022-07-01 ENCOUNTER — Ambulatory Visit: Payer: PPO | Attending: Cardiology | Admitting: Cardiology

## 2022-07-01 ENCOUNTER — Encounter: Payer: Self-pay | Admitting: Cardiology

## 2022-07-01 VITALS — BP 150/70 | HR 60 | Ht 69.0 in | Wt 160.0 lb

## 2022-07-01 DIAGNOSIS — R55 Syncope and collapse: Secondary | ICD-10-CM

## 2022-07-01 DIAGNOSIS — I680 Cerebral amyloid angiopathy: Secondary | ICD-10-CM | POA: Diagnosis not present

## 2022-07-01 DIAGNOSIS — I10 Essential (primary) hypertension: Secondary | ICD-10-CM

## 2022-07-12 DIAGNOSIS — E1065 Type 1 diabetes mellitus with hyperglycemia: Secondary | ICD-10-CM | POA: Diagnosis not present

## 2022-07-15 DIAGNOSIS — E871 Hypo-osmolality and hyponatremia: Secondary | ICD-10-CM | POA: Diagnosis not present

## 2022-07-19 DIAGNOSIS — L308 Other specified dermatitis: Secondary | ICD-10-CM | POA: Diagnosis not present

## 2022-07-19 DIAGNOSIS — L508 Other urticaria: Secondary | ICD-10-CM | POA: Diagnosis not present

## 2022-07-23 DIAGNOSIS — Z9641 Presence of insulin pump (external) (internal): Secondary | ICD-10-CM | POA: Diagnosis not present

## 2022-07-23 DIAGNOSIS — E109 Type 1 diabetes mellitus without complications: Secondary | ICD-10-CM | POA: Diagnosis not present

## 2022-07-23 DIAGNOSIS — Z794 Long term (current) use of insulin: Secondary | ICD-10-CM | POA: Diagnosis not present

## 2022-08-12 DIAGNOSIS — E1065 Type 1 diabetes mellitus with hyperglycemia: Secondary | ICD-10-CM | POA: Diagnosis not present

## 2022-08-24 DIAGNOSIS — B351 Tinea unguium: Secondary | ICD-10-CM | POA: Diagnosis not present

## 2022-08-24 DIAGNOSIS — E1142 Type 2 diabetes mellitus with diabetic polyneuropathy: Secondary | ICD-10-CM | POA: Diagnosis not present

## 2022-08-25 DIAGNOSIS — Z794 Long term (current) use of insulin: Secondary | ICD-10-CM | POA: Diagnosis not present

## 2022-08-25 DIAGNOSIS — Z9641 Presence of insulin pump (external) (internal): Secondary | ICD-10-CM | POA: Diagnosis not present

## 2022-08-25 DIAGNOSIS — E109 Type 1 diabetes mellitus without complications: Secondary | ICD-10-CM | POA: Diagnosis not present

## 2022-09-09 ENCOUNTER — Ambulatory Visit: Payer: PPO | Admitting: Neurology

## 2022-09-09 ENCOUNTER — Encounter: Payer: Self-pay | Admitting: Neurology

## 2022-09-09 VITALS — BP 146/64 | HR 60 | Ht 69.0 in | Wt 160.0 lb

## 2022-09-09 DIAGNOSIS — G301 Alzheimer's disease with late onset: Secondary | ICD-10-CM | POA: Diagnosis not present

## 2022-09-09 DIAGNOSIS — G3184 Mild cognitive impairment, so stated: Secondary | ICD-10-CM

## 2022-09-09 DIAGNOSIS — I680 Cerebral amyloid angiopathy: Secondary | ICD-10-CM | POA: Diagnosis not present

## 2022-09-09 DIAGNOSIS — F02A0 Dementia in other diseases classified elsewhere, mild, without behavioral disturbance, psychotic disturbance, mood disturbance, and anxiety: Secondary | ICD-10-CM | POA: Diagnosis not present

## 2022-09-09 NOTE — Progress Notes (Signed)
[facility]  PATIENT: Scott Garner DOB: 13-Dec-1945  REQUESTING CLINICIAN: Sharilyn Sites, MD HISTORY FROM: Patient, spouse and chart review  REASON FOR VISIT: Syncope/Abnormal MRI    HISTORICAL  CHIEF COMPLAINT:  Chief Complaint  Patient presents with   Follow-up    Rm 33, with wife  here for memory evaluation     INTERVAL HISTORY 09/09/2022:  Patient presents today for follow up, he is accompanied by spouse. They are here today for memory concerns. Patient feels that his memory is fine. He is forgetful but it is due to him being retired and also his old age.  Per wife, he is forgetful, he has difficulty with keeping up with conversation. He still drives short distance, denied being lost in familiar places, he does not cook, but can fix himself a plate. Wife is handling all the finance and keeping up with the appointments.  He does not reminder for self care.      HISTORY OF PRESENT ILLNESS:  This is a 77 year old gentleman past medical history of type 1 diabetes mellitus, hyperlipidemia, memory loss who is presenting after syncopal episode on October 2.  Wife reported on that day patient was outside in the sun, he suddenly felt swimmy headed then passed out.  Family helped patient to the house, EMS was called, initially he was confused but later regained full consciousness.  He was taken to the ED, MRI negative for any acute stroke but finding was suggestive of amyloid angiopathy for which he was referred here.  He was also found to be hyponatremic to 124.  Patient was observed in the ED with return to baseline and discharged home.  Per wife patient had multiple syncope in the past, in December 22 he had an insulin pump malfunction and his sugar was low for a long time.  He was in ICU for 2 days.  In March of this year also he had another pump malfunction and had a DKA again was admitted to the hospital.  In June he had a syncopal episode due to dehydration.  September 6 had another syncopal episode due to dehydration and his last 1 was  on October second.  With all these episodes, patient reported feeling swimmy headed before passing out.  Denies any major injury from these syncope.   OTHER MEDICAL CONDITIONS: Type I Diabetes Mellitus, Hypertension, Hyperlipidemia, memory loss    REVIEW OF SYSTEMS: Full 14 system review of systems performed and negative with exception of: As noted in the HPI   ALLERGIES: Allergies  Allergen Reactions   Wound Dressing Adhesive Rash   Neosporin [Bacitracin-Polymyxin B]     hives    HOME MEDICATIONS: Outpatient Medications Prior to Visit  Medication Sig Dispense Refill   amLODipine (NORVASC) 5 MG tablet Take 5 mg by mouth daily.     aspirin EC 81 MG tablet Take 81 mg by mouth daily.     COD LIVER OIL PO Take 1 capsule by mouth daily.     fish oil-omega-3 fatty acids 1000 MG capsule Take 1 g by mouth daily.     insulin aspart (NOVOLOG) 100 UNIT/ML injection Inject into the skin continuous. Patient has an insulin pump.     Insulin Syringe-Needle U-100 (INSULIN SYRINGE .3CC/30GX5/16") 30G X 5/16" 0.3 ML MISC Use as directed 10 each 1   lisinopril (ZESTRIL) 20 MG tablet Take 1 tablet (20 mg total) by mouth daily.     LUMIGAN 0.01 % SOLN Place 1 drop into both  eyes at bedtime.     Multiple Vitamin (MULTIVITAMIN WITH MINERALS) TABS Take 1 tablet by mouth daily.     pravastatin (PRAVACHOL) 10 MG tablet Take 10 mg by mouth at bedtime.     No facility-administered medications prior to visit.    PAST MEDICAL HISTORY: Past Medical History:  Diagnosis Date   Cerebral amyloid angiopathy (CODE)    Diabetes mellitus without complication ([place])    Hypercholesteremia    Hypertension     PAST SURGICAL HISTORY: Past Surgical History:  Procedure Laterality Date   COLONOSCOPY     COLONOSCOPY N/A 10/24/2012   Procedure: COLONOSCOPY;  Surgeon: Jamesetta So, MD;  [facility];  Service: Gastroenterology;   Laterality: N/A;   HERNIA REPAIR     NASAL SINUS SURGERY      FAMILY HISTORY: Family History  Problem Relation Age of Onset   Stroke Father    Colon cancer Brother    Stroke Brother     SOCIAL HISTORY: Social History   Socioeconomic History   Marital status: Married    Spouse name: Not on file   Number of children: 1   Years of education: Not on file   Highest education level: Not on file  Occupational History   Not on file  Tobacco Use   Smoking status: Never   Smokeless tobacco: Not on file  Vaping Use   Vaping Use: Never used  Substance and Sexual Activity   Alcohol use: No   Drug use: No   Sexual activity: Not on file  Other Topics Concern   Not on file  Social History Narrative   Not on file   Social Determinants of Health   Financial Resource Strain: Not on file  Food Insecurity: Not on file  Transportation Needs: Not on file  Physical Activity: Not on file  Stress: Not on file  Social Connections: Not on file  Intimate Partner Violence: Not on file    PHYSICAL EXAM  GENERAL EXAM/CONSTITUTIONAL: Vitals:  Vitals:   09/09/22 1546  BP: (!) 146/64  Pulse: 60  Weight: 160 lb (72.6 kg)  Height: 5' 9"$  (1.753 m)   Body mass index is 23.63 kg/m. Wt Readings from Last 3 Encounters:  09/09/22 160 lb (72.6 kg)  07/01/22 160 lb (72.6 kg)  05/25/22 160 lb (72.6 kg)   Patient is in no distress; well developed, nourished and groomed; neck is supple  EYES: Visual fields full to confrontation, Extraocular movements intacts,   MUSCULOSKELETAL: Gait, strength, tone, movements noted in Neurologic exam below  NEUROLOGIC: MENTAL STATUS:      No data to display         awake, alert, oriented to person, place and time. Know the current president but not the previous, able to count the number of quarter in $1.75 language fluent, comprehension intact, naming intact     09/09/2022    3:46 PM  [facility] Cognitive Assessment   Visuospatial/ Executive  (0/5) 0  Naming (0/3) 2  Attention: Read list of digits (0/2) 1  Attention: Read list of letters (0/1) 1  Attention: Serial 7 subtraction starting at 100 (0/3) 1  Language: Repeat phrase (0/2) 0  Language : Fluency (0/1) 1  Abstraction (0/2) 2  Delayed Recall (0/5) 0  Orientation (0/6) 2  Total 10      CRANIAL NERVE:  2nd, 3rd, 4th, 6th - visual fields full to confrontation, extraocular muscles intact, no nystagmus 5th - facial sensation symmetric 7th - facial  strength symmetric 8th - hearing intact 9th - palate elevates symmetrically, uvula midline 11th - shoulder shrug symmetric 12th - tongue protrusion midline  MOTOR:  normal bulk and tone, full strength in the BUE, BLE  SENSORY:  normal and symmetric to light touch  COORDINATION:  finger-nose-finger, fine finger movements normal  GAIT/STATION:  normal   DIAGNOSTIC DATA (LABS, IMAGING, TESTING) - I reviewed patient records, labs, notes, testing and imaging myself where available.  Lab Results  Component Value Date   WBC 9.4 05/03/2022   HGB 14.3 05/03/2022   HCT 41.1 05/03/2022   MCV 89.0 05/03/2022   PLT 276 05/03/2022      Component Value Date/Time   NA 124 (L) 05/03/2022 1749   K 4.2 05/03/2022 1749   CL 93 (L) 05/03/2022 1749   CO2 24 05/03/2022 1749   GLUCOSE 156 (H) 05/03/2022 1749   BUN 16 05/03/2022 1749   CREATININE 0.93 05/03/2022 1749   CALCIUM 8.2 (L) 05/03/2022 1749   PROT 6.5 05/03/2022 1749   ALBUMIN 4.0 05/03/2022 1749   AST 25 05/03/2022 1749   ALT 21 05/03/2022 1749   ALKPHOS 60 05/03/2022 1749   BILITOT 1.0 05/03/2022 1749   GFRNONAA >60 05/03/2022 1749   GFRAA  07/14/2009 1855    >60        The eGFR has been calculated using the MDRD equation. This calculation has not been validated in all clinical situations. eGFR's persistently <60 mL/min signify possible Chronic Kidney Disease.   No results found for: "CHOL", "HDL", "[place]", "LDLDIRECT", "TRIG", "CHOLHDL" Lab  Results  Component Value Date   HGBA1C 7.0 (H) 07/23/2021   Lab Results  Component Value Date   VITAMINB12 747 09/09/2022   Lab Results  Component Value Date   TSH 2.140 09/09/2022    MRI Brain 05/03/2022 1. No acute or reversible finding. 2. Brain atrophy with extensive chronic small vessel ischemia and signs of amyloid angiopathy   ASSESSMENT AND PLAN  77 y.o. year old male with history of diabetes mellitus type 2, hyperlipidemia, who is presenting for memory concerns. He feels like his memory is fine, since he is retired and now getting old, he is allowed to forget some details. He is somewhat dependent on his wife but still can maintains ADLs. He is agreeable to complete dementia labs including TSH, B12 and ATN profile but would like to defer the neuropsychological testing.  I will call him to go over the results, at that time we will discuss initiation medication such as Aricept. He voices understanding, follow up in a year or sooner if worse.     1. Mild cognitive impairment   2. Cerebral amyloid angiopathy (CODE)      Patient Instructions  ATN profile with TSH and B 12 level  Continue your other medications Will discuss need for Neuropsych testing and medication once we get the ATN results  Follow up in 1 year or sooner if worse   Orders Placed This Encounter  Procedures   ATN PROFILE   TSH   Vitamin B12    No orders of the defined types were placed in this encounter.   Return in about 1 year (around 09/10/2023).  I have spent a total of 43 minutes dedicated to this patient today, preparing to see patient, performing a medically appropriate examination and evaluation, ordering tests and/or medications and procedures, and counseling and educating the patient/family/caregiver; independently interpreting result and communicating results to the family/patient/caregiver; and documenting clinical information in the  electronic medical record.   Alric Ran, MD  09/10/2022, 8:31 AM  [facility] [address] [place], [place] [address] [phone]

## 2022-09-09 NOTE — Patient Instructions (Signed)
ATN profile with TSH and B 12 level  Continue your other medications Will discuss need for Neuropsych testing and medication once we get the ATN results  Follow up in 1 year or sooner if worse

## 2022-09-12 DIAGNOSIS — E1065 Type 1 diabetes mellitus with hyperglycemia: Secondary | ICD-10-CM | POA: Diagnosis not present

## 2022-09-14 LAB — VITAMIN B12: Vitamin B-12: 747 pg/mL (ref 232–1245)

## 2022-09-14 LAB — ATN PROFILE
A -- Beta-amyloid 42/40 Ratio: 0.092 — ABNORMAL LOW (ref >0.102)
Beta-amyloid 40: 146.88 pg/mL
Beta-amyloid 42: 13.51 pg/mL
N -- NfL, Plasma: 5.65 pg/mL (ref 0.00–7.64)
T -- p-tau181: 1.5 pg/mL — ABNORMAL HIGH (ref 0.00–0.97)

## 2022-09-14 LAB — TSH: TSH: 2.14 u[IU]/mL (ref 0.450–4.500)

## 2022-09-16 MED ORDER — DONEPEZIL HCL 10 MG PO TABS: 10.0000 mg | ORAL_TABLET | Freq: Every day | ORAL | 11 refills | Status: DC

## 2022-09-16 NOTE — Addendum Note (Signed)
Addended byAlric Ran on: 09/16/2022 03:17 PM   Modules accepted: Orders

## 2022-09-17 DIAGNOSIS — E871 Hypo-osmolality and hyponatremia: Secondary | ICD-10-CM | POA: Diagnosis not present

## 2022-09-17 DIAGNOSIS — I10 Essential (primary) hypertension: Secondary | ICD-10-CM | POA: Diagnosis not present

## 2022-09-17 DIAGNOSIS — E7800 Pure hypercholesterolemia, unspecified: Secondary | ICD-10-CM | POA: Diagnosis not present

## 2022-09-17 DIAGNOSIS — E109 Type 1 diabetes mellitus without complications: Secondary | ICD-10-CM | POA: Diagnosis not present

## 2022-09-17 DIAGNOSIS — R55 Syncope and collapse: Secondary | ICD-10-CM | POA: Diagnosis not present

## 2022-09-17 DIAGNOSIS — R413 Other amnesia: Secondary | ICD-10-CM | POA: Diagnosis not present

## 2022-09-17 DIAGNOSIS — Z9641 Presence of insulin pump (external) (internal): Secondary | ICD-10-CM | POA: Diagnosis not present

## 2022-09-23 DIAGNOSIS — E109 Type 1 diabetes mellitus without complications: Secondary | ICD-10-CM | POA: Diagnosis not present

## 2022-09-23 DIAGNOSIS — Z9641 Presence of insulin pump (external) (internal): Secondary | ICD-10-CM | POA: Diagnosis not present

## 2022-09-23 DIAGNOSIS — Z794 Long term (current) use of insulin: Secondary | ICD-10-CM | POA: Diagnosis not present

## 2022-10-11 DIAGNOSIS — E1065 Type 1 diabetes mellitus with hyperglycemia: Secondary | ICD-10-CM | POA: Diagnosis not present

## 2022-10-13 ENCOUNTER — Telehealth: Payer: Self-pay | Admitting: Neurology

## 2022-10-13 NOTE — Telephone Encounter (Signed)
Pt wife Izora Gala) called and stated pt is taking donepezil (ARICEPT) 10 MG tablet. Stated it is giving ot diarrhea three to four times a day. She is requesting a call back to discuss.

## 2022-10-14 ENCOUNTER — Other Ambulatory Visit: Payer: Self-pay | Admitting: Neurology

## 2022-10-14 MED ORDER — RIVASTIGMINE TARTRATE 1.5 MG PO CAPS: 1.5000 mg | ORAL_CAPSULE | Freq: Two times a day (BID) | ORAL | 11 refills | Status: DC

## 2022-10-14 NOTE — Telephone Encounter (Signed)
I called patient's wife, Izora Gala, per DPR.  I advised her to discontinue patient's donepezil.  Patient should start taking Exelon twice daily starting April 1.  If patient's diarrhea does not improve, I encouraged her to continue following up with patient's PCP. Pt's wife  verbalized understanding. Patient will let us know if he has questions or concerns.

## 2022-10-14 NOTE — Telephone Encounter (Signed)
Please call and advise patient/wife to discontinue the Donepezil. I will prescribed them Exelon to take twice daily to start April 1.   Dr. April Manson

## 2022-10-14 NOTE — Telephone Encounter (Signed)
I called patient's wife, Izora Gala, per DPR.  Patient has been taking 5 mg of donepezil since February 16.  He started having diarrhea when he started taking the donepezil.  Unfortunately, the diarrhea is up to 3 times daily.  She is wondering if they can try another medication instead of the donepezil.

## 2022-10-27 DIAGNOSIS — Z9641 Presence of insulin pump (external) (internal): Secondary | ICD-10-CM | POA: Diagnosis not present

## 2022-10-27 DIAGNOSIS — E109 Type 1 diabetes mellitus without complications: Secondary | ICD-10-CM | POA: Diagnosis not present

## 2022-10-27 DIAGNOSIS — Z794 Long term (current) use of insulin: Secondary | ICD-10-CM | POA: Diagnosis not present

## 2022-11-09 DIAGNOSIS — B351 Tinea unguium: Secondary | ICD-10-CM | POA: Diagnosis not present

## 2022-11-09 DIAGNOSIS — E1142 Type 2 diabetes mellitus with diabetic polyneuropathy: Secondary | ICD-10-CM | POA: Diagnosis not present

## 2022-11-11 DIAGNOSIS — E1065 Type 1 diabetes mellitus with hyperglycemia: Secondary | ICD-10-CM | POA: Diagnosis not present

## 2022-11-25 DIAGNOSIS — Z794 Long term (current) use of insulin: Secondary | ICD-10-CM | POA: Diagnosis not present

## 2022-11-25 DIAGNOSIS — E109 Type 1 diabetes mellitus without complications: Secondary | ICD-10-CM | POA: Diagnosis not present

## 2022-11-25 DIAGNOSIS — Z9641 Presence of insulin pump (external) (internal): Secondary | ICD-10-CM | POA: Diagnosis not present

## 2022-12-11 DIAGNOSIS — E1065 Type 1 diabetes mellitus with hyperglycemia: Secondary | ICD-10-CM | POA: Diagnosis not present

## 2022-12-16 DIAGNOSIS — G629 Polyneuropathy, unspecified: Secondary | ICD-10-CM | POA: Diagnosis not present

## 2022-12-16 DIAGNOSIS — R55 Syncope and collapse: Secondary | ICD-10-CM | POA: Diagnosis not present

## 2022-12-16 DIAGNOSIS — R413 Other amnesia: Secondary | ICD-10-CM | POA: Diagnosis not present

## 2022-12-16 DIAGNOSIS — E871 Hypo-osmolality and hyponatremia: Secondary | ICD-10-CM | POA: Diagnosis not present

## 2022-12-16 DIAGNOSIS — E7800 Pure hypercholesterolemia, unspecified: Secondary | ICD-10-CM | POA: Diagnosis not present

## 2022-12-16 DIAGNOSIS — I10 Essential (primary) hypertension: Secondary | ICD-10-CM | POA: Diagnosis not present

## 2022-12-16 DIAGNOSIS — Z9641 Presence of insulin pump (external) (internal): Secondary | ICD-10-CM | POA: Diagnosis not present

## 2022-12-16 DIAGNOSIS — E109 Type 1 diabetes mellitus without complications: Secondary | ICD-10-CM | POA: Diagnosis not present

## 2022-12-21 DIAGNOSIS — H40013 Open angle with borderline findings, low risk, bilateral: Secondary | ICD-10-CM | POA: Diagnosis not present

## 2022-12-21 DIAGNOSIS — H2513 Age-related nuclear cataract, bilateral: Secondary | ICD-10-CM | POA: Diagnosis not present

## 2022-12-21 DIAGNOSIS — E119 Type 2 diabetes mellitus without complications: Secondary | ICD-10-CM | POA: Diagnosis not present

## 2022-12-24 DIAGNOSIS — E109 Type 1 diabetes mellitus without complications: Secondary | ICD-10-CM | POA: Diagnosis not present

## 2022-12-24 DIAGNOSIS — Z794 Long term (current) use of insulin: Secondary | ICD-10-CM | POA: Diagnosis not present

## 2022-12-24 DIAGNOSIS — Z9641 Presence of insulin pump (external) (internal): Secondary | ICD-10-CM | POA: Diagnosis not present

## 2023-01-11 DIAGNOSIS — E1065 Type 1 diabetes mellitus with hyperglycemia: Secondary | ICD-10-CM | POA: Diagnosis not present

## 2023-01-27 DIAGNOSIS — Z9641 Presence of insulin pump (external) (internal): Secondary | ICD-10-CM | POA: Diagnosis not present

## 2023-01-27 DIAGNOSIS — Z794 Long term (current) use of insulin: Secondary | ICD-10-CM | POA: Diagnosis not present

## 2023-01-27 DIAGNOSIS — E1142 Type 2 diabetes mellitus with diabetic polyneuropathy: Secondary | ICD-10-CM | POA: Diagnosis not present

## 2023-01-27 DIAGNOSIS — E109 Type 1 diabetes mellitus without complications: Secondary | ICD-10-CM | POA: Diagnosis not present

## 2023-01-27 DIAGNOSIS — B351 Tinea unguium: Secondary | ICD-10-CM | POA: Diagnosis not present

## 2023-02-10 DIAGNOSIS — E1065 Type 1 diabetes mellitus with hyperglycemia: Secondary | ICD-10-CM | POA: Diagnosis not present

## 2023-02-24 DIAGNOSIS — Z9641 Presence of insulin pump (external) (internal): Secondary | ICD-10-CM | POA: Diagnosis not present

## 2023-02-24 DIAGNOSIS — Z794 Long term (current) use of insulin: Secondary | ICD-10-CM | POA: Diagnosis not present

## 2023-02-24 DIAGNOSIS — E109 Type 1 diabetes mellitus without complications: Secondary | ICD-10-CM | POA: Diagnosis not present

## 2023-03-13 DIAGNOSIS — E1065 Type 1 diabetes mellitus with hyperglycemia: Secondary | ICD-10-CM | POA: Diagnosis not present

## 2023-03-18 DIAGNOSIS — Z9641 Presence of insulin pump (external) (internal): Secondary | ICD-10-CM | POA: Diagnosis not present

## 2023-03-18 DIAGNOSIS — R55 Syncope and collapse: Secondary | ICD-10-CM | POA: Diagnosis not present

## 2023-03-18 DIAGNOSIS — E7800 Pure hypercholesterolemia, unspecified: Secondary | ICD-10-CM | POA: Diagnosis not present

## 2023-03-18 DIAGNOSIS — I10 Essential (primary) hypertension: Secondary | ICD-10-CM | POA: Diagnosis not present

## 2023-03-18 DIAGNOSIS — G629 Polyneuropathy, unspecified: Secondary | ICD-10-CM | POA: Diagnosis not present

## 2023-03-18 DIAGNOSIS — E871 Hypo-osmolality and hyponatremia: Secondary | ICD-10-CM | POA: Diagnosis not present

## 2023-03-18 DIAGNOSIS — E109 Type 1 diabetes mellitus without complications: Secondary | ICD-10-CM | POA: Diagnosis not present

## 2023-03-18 DIAGNOSIS — R413 Other amnesia: Secondary | ICD-10-CM | POA: Diagnosis not present

## 2023-04-06 DIAGNOSIS — Z794 Long term (current) use of insulin: Secondary | ICD-10-CM | POA: Diagnosis not present

## 2023-04-06 DIAGNOSIS — E109 Type 1 diabetes mellitus without complications: Secondary | ICD-10-CM | POA: Diagnosis not present

## 2023-04-06 DIAGNOSIS — Z9641 Presence of insulin pump (external) (internal): Secondary | ICD-10-CM | POA: Diagnosis not present

## 2023-04-07 DIAGNOSIS — B351 Tinea unguium: Secondary | ICD-10-CM | POA: Diagnosis not present

## 2023-04-07 DIAGNOSIS — E1142 Type 2 diabetes mellitus with diabetic polyneuropathy: Secondary | ICD-10-CM | POA: Diagnosis not present

## 2023-04-13 DIAGNOSIS — E1065 Type 1 diabetes mellitus with hyperglycemia: Secondary | ICD-10-CM | POA: Diagnosis not present

## 2023-05-06 DIAGNOSIS — E109 Type 1 diabetes mellitus without complications: Secondary | ICD-10-CM | POA: Diagnosis not present

## 2023-05-06 DIAGNOSIS — Z9641 Presence of insulin pump (external) (internal): Secondary | ICD-10-CM | POA: Diagnosis not present

## 2023-05-06 DIAGNOSIS — Z794 Long term (current) use of insulin: Secondary | ICD-10-CM | POA: Diagnosis not present

## 2023-05-13 DIAGNOSIS — E1065 Type 1 diabetes mellitus with hyperglycemia: Secondary | ICD-10-CM | POA: Diagnosis not present

## 2023-05-19 DIAGNOSIS — Z6825 Body mass index (BMI) 25.0-25.9, adult: Secondary | ICD-10-CM | POA: Diagnosis not present

## 2023-05-19 DIAGNOSIS — L259 Unspecified contact dermatitis, unspecified cause: Secondary | ICD-10-CM | POA: Diagnosis not present

## 2023-06-07 DIAGNOSIS — Z9641 Presence of insulin pump (external) (internal): Secondary | ICD-10-CM | POA: Diagnosis not present

## 2023-06-07 DIAGNOSIS — E109 Type 1 diabetes mellitus without complications: Secondary | ICD-10-CM | POA: Diagnosis not present

## 2023-06-07 DIAGNOSIS — Z794 Long term (current) use of insulin: Secondary | ICD-10-CM | POA: Diagnosis not present

## 2023-06-13 DIAGNOSIS — E1065 Type 1 diabetes mellitus with hyperglycemia: Secondary | ICD-10-CM | POA: Diagnosis not present

## 2023-06-21 DIAGNOSIS — E119 Type 2 diabetes mellitus without complications: Secondary | ICD-10-CM | POA: Diagnosis not present

## 2023-06-21 DIAGNOSIS — H2513 Age-related nuclear cataract, bilateral: Secondary | ICD-10-CM | POA: Diagnosis not present

## 2023-06-21 DIAGNOSIS — H40013 Open angle with borderline findings, low risk, bilateral: Secondary | ICD-10-CM | POA: Diagnosis not present

## 2023-06-22 DIAGNOSIS — E109 Type 1 diabetes mellitus without complications: Secondary | ICD-10-CM | POA: Diagnosis not present

## 2023-06-22 DIAGNOSIS — E871 Hypo-osmolality and hyponatremia: Secondary | ICD-10-CM | POA: Diagnosis not present

## 2023-06-22 DIAGNOSIS — R55 Syncope and collapse: Secondary | ICD-10-CM | POA: Diagnosis not present

## 2023-06-22 DIAGNOSIS — R413 Other amnesia: Secondary | ICD-10-CM | POA: Diagnosis not present

## 2023-06-22 DIAGNOSIS — G629 Polyneuropathy, unspecified: Secondary | ICD-10-CM | POA: Diagnosis not present

## 2023-06-22 DIAGNOSIS — I10 Essential (primary) hypertension: Secondary | ICD-10-CM | POA: Diagnosis not present

## 2023-06-22 DIAGNOSIS — E7800 Pure hypercholesterolemia, unspecified: Secondary | ICD-10-CM | POA: Diagnosis not present

## 2023-06-22 DIAGNOSIS — Z9641 Presence of insulin pump (external) (internal): Secondary | ICD-10-CM | POA: Diagnosis not present

## 2023-06-23 DIAGNOSIS — B351 Tinea unguium: Secondary | ICD-10-CM | POA: Diagnosis not present

## 2023-06-23 DIAGNOSIS — E1142 Type 2 diabetes mellitus with diabetic polyneuropathy: Secondary | ICD-10-CM | POA: Diagnosis not present

## 2023-07-07 DIAGNOSIS — E109 Type 1 diabetes mellitus without complications: Secondary | ICD-10-CM | POA: Diagnosis not present

## 2023-07-07 DIAGNOSIS — Z9641 Presence of insulin pump (external) (internal): Secondary | ICD-10-CM | POA: Diagnosis not present

## 2023-07-07 DIAGNOSIS — Z794 Long term (current) use of insulin: Secondary | ICD-10-CM | POA: Diagnosis not present

## 2023-07-13 DIAGNOSIS — E1065 Type 1 diabetes mellitus with hyperglycemia: Secondary | ICD-10-CM | POA: Diagnosis not present

## 2023-08-08 DIAGNOSIS — Z9641 Presence of insulin pump (external) (internal): Secondary | ICD-10-CM | POA: Diagnosis not present

## 2023-08-08 DIAGNOSIS — E109 Type 1 diabetes mellitus without complications: Secondary | ICD-10-CM | POA: Diagnosis not present

## 2023-08-08 DIAGNOSIS — Z794 Long term (current) use of insulin: Secondary | ICD-10-CM | POA: Diagnosis not present

## 2023-08-13 DIAGNOSIS — E1065 Type 1 diabetes mellitus with hyperglycemia: Secondary | ICD-10-CM | POA: Diagnosis not present

## 2023-09-01 DIAGNOSIS — E1142 Type 2 diabetes mellitus with diabetic polyneuropathy: Secondary | ICD-10-CM | POA: Diagnosis not present

## 2023-09-01 DIAGNOSIS — B351 Tinea unguium: Secondary | ICD-10-CM | POA: Diagnosis not present

## 2023-09-07 DIAGNOSIS — Z9641 Presence of insulin pump (external) (internal): Secondary | ICD-10-CM | POA: Diagnosis not present

## 2023-09-07 DIAGNOSIS — E109 Type 1 diabetes mellitus without complications: Secondary | ICD-10-CM | POA: Diagnosis not present

## 2023-09-07 DIAGNOSIS — Z794 Long term (current) use of insulin: Secondary | ICD-10-CM | POA: Diagnosis not present

## 2023-09-13 DIAGNOSIS — E1065 Type 1 diabetes mellitus with hyperglycemia: Secondary | ICD-10-CM | POA: Diagnosis not present

## 2023-09-15 ENCOUNTER — Ambulatory Visit: Payer: PPO | Admitting: Neurology

## 2023-09-15 ENCOUNTER — Encounter: Payer: Self-pay | Admitting: Neurology

## 2023-09-15 VITALS — BP 143/61 | HR 80 | Ht 69.0 in | Wt 158.0 lb

## 2023-09-15 DIAGNOSIS — H6120 Impacted cerumen, unspecified ear: Secondary | ICD-10-CM | POA: Diagnosis not present

## 2023-09-15 DIAGNOSIS — G301 Alzheimer's disease with late onset: Secondary | ICD-10-CM

## 2023-09-15 DIAGNOSIS — R55 Syncope and collapse: Secondary | ICD-10-CM | POA: Diagnosis not present

## 2023-09-15 DIAGNOSIS — Z6824 Body mass index (BMI) 24.0-24.9, adult: Secondary | ICD-10-CM | POA: Diagnosis not present

## 2023-09-15 DIAGNOSIS — F02A0 Dementia in other diseases classified elsewhere, mild, without behavioral disturbance, psychotic disturbance, mood disturbance, and anxiety: Secondary | ICD-10-CM | POA: Diagnosis not present

## 2023-09-15 MED ORDER — RIVASTIGMINE TARTRATE 1.5 MG PO CAPS: 1.5000 mg | ORAL_CAPSULE | Freq: Two times a day (BID) | ORAL | 3 refills | Status: AC

## 2023-09-15 NOTE — Progress Notes (Signed)
[facility]  PATIENT: Scott Garner DOB: 02-Oct-1945  REQUESTING CLINICIAN: Assunta Found, MD HISTORY FROM: Patient, spouse and chart review  REASON FOR VISIT: Dementia follow up   HISTORICAL  CHIEF COMPLAINT:  Chief Complaint  Patient presents with   Memory Loss    Rm12, wife present, NO MEMORY TEST (SCORED LOW):pt and wife stated that he feels like he's doing better but not so much w/directions. Pt reported hearing issues, fainted in 09/09/23.    INTERVAL HISTORY 09/15/2023:  Patient presents today for follow-up, he is accompanied by spouse. Last visit was a year ago.  At that time we obtained it his ATN profile which show presence of Alzheimer disease biomarkers.  We tried him on Aricept but could not tolerate the medication due to side effect and currently he is on Exelon 1.5 mg twice daily and has been doing well.    Wife tells me that he is fine, he is stable at home other than the he had 2 syncopal episodes on February 5 and February 7 while he was outside cutting some log.  Wife tells me the first episode on the 5th, he did not actually pass out he was just feeling faint but with the second episode on the 7 he lost consciousness and was having some deep breathing.  After the event he was tired, needing help to get in the house and he slept entire afternoon.  They report his previous syncopal episode was more than a year ago. His glucose level was fine on both syncopal episodes.    INTERVAL HISTORY 09/09/2022:  Patient presents today for follow up, he is accompanied by spouse. They are here today for memory concerns. Patient feels that his memory is fine. He is forgetful but it is due to him being retired and also his old age.  Per wife, he is forgetful, he has difficulty with keeping up with conversation. He still drives short distance, denied being lost in familiar places, he does not cook, but can fix himself a plate. Wife is handling all the finance and keeping  up with the appointments.  He does not reminder for self care.      HISTORY OF PRESENT ILLNESS:  This is a 78 year old gentleman past medical history of type 1 diabetes mellitus, hyperlipidemia, memory loss who is presenting after syncopal episode on October 2.  Wife reported on that day patient was outside in the sun, he suddenly felt swimmy headed then passed out.  Family helped patient to the house, EMS was called, initially he was confused but later regained full consciousness.  He was taken to the ED, MRI negative for any acute stroke but finding was suggestive of amyloid angiopathy for which he was referred here.  He was also found to be hyponatremic to 124.  Patient was observed in the ED with return to baseline and discharged home.  Per wife patient had multiple syncope in the past, in December 22 he had an insulin pump malfunction and his sugar was low for a long time.  He was in ICU for 2 days.  In March of this year also he had another pump malfunction and had a DKA again was admitted to the hospital.  In June he had a syncopal episode due to dehydration. September 6 had another syncopal episode due to dehydration and his last 1 was  on October second.  With all these episodes, patient reported feeling swimmy headed before passing out.  Denies any major injury  from these syncope.   OTHER MEDICAL CONDITIONS: Type I Diabetes Mellitus, Hypertension, Hyperlipidemia, memory loss    REVIEW OF SYSTEMS: Full 14 system review of systems performed and negative with exception of: As noted in the HPI   ALLERGIES: Allergies  Allergen Reactions   Wound Dressing Adhesive Rash   Neosporin [Bacitracin-Polymyxin B]     hives    HOME MEDICATIONS: Outpatient Medications Prior to Visit  Medication Sig Dispense Refill   amLODipine (NORVASC) 5 MG tablet Take 5 mg by mouth daily.     aspirin EC 81 MG tablet Take 81 mg by mouth daily.     COD LIVER OIL PO Take 1 capsule by mouth daily.     fish  oil-omega-3 fatty acids 1000 MG capsule Take 1 g by mouth daily.     insulin aspart (NOVOLOG) 100 UNIT/ML injection Inject into the skin continuous. Patient has an insulin pump.     Insulin Syringe-Needle U-100 (INSULIN SYRINGE .3CC/30GX5/16") 30G X 5/16" 0.3 ML MISC Use as directed 10 each 1   lisinopril (ZESTRIL) 20 MG tablet Take 1 tablet (20 mg total) by mouth daily.     LUMIGAN 0.01 % SOLN Place 1 drop into both eyes at bedtime.     Multiple Vitamin (MULTIVITAMIN WITH MINERALS) TABS Take 1 tablet by mouth daily.     pravastatin (PRAVACHOL) 10 MG tablet Take 10 mg by mouth at bedtime.     rivastigmine (EXELON) 1.5 MG capsule Take 1 capsule (1.5 mg total) by mouth 2 (two) times daily. 60 capsule 11   No facility-administered medications prior to visit.    PAST MEDICAL HISTORY: Past Medical History:  Diagnosis Date   Cerebral amyloid angiopathy (CODE)    Diabetes mellitus without complication (HCC)    Hypercholesteremia    Hypertension     PAST SURGICAL HISTORY: Past Surgical History:  Procedure Laterality Date   COLONOSCOPY     COLONOSCOPY N/A 10/24/2012   Procedure: COLONOSCOPY;  Surgeon: Dalia Heading, MD;  [facility];  Service: Gastroenterology;  Laterality: N/A;   HERNIA REPAIR     NASAL SINUS SURGERY      FAMILY HISTORY: Family History  Problem Relation Age of Onset   Stroke Father    Colon cancer Brother    Stroke Brother     SOCIAL HISTORY: Social History   Socioeconomic History   Marital status: Married    Spouse name: Not on file   Number of children: 1   Years of education: Not on file   Highest education level: Not on file  Occupational History   Not on file  Tobacco Use   Smoking status: Never   Smokeless tobacco: Not on file  Vaping Use   Vaping status: Never Used  Substance and Sexual Activity   Alcohol use: No   Drug use: No   Sexual activity: Not on file  Other Topics Concern   Not on file  Social History Narrative   Not on  file   Social Drivers of Health   Financial Resource Strain: Unknown (10/02/2021)   Received from [facility]   Overall Financial Resource Strain (CARDIA)    Difficulty of Paying Living Expenses: Patient declined  Food Insecurity: Not on file  Transportation Needs: No Transportation Needs (10/03/2021)   Received from [facility], [facility]   PRAPARE - Transportation    Lack of Transportation (Medical): No    Lack of Transportation (Non-Medical): No  Physical Activity: Not on  file  Stress: Unknown (10/02/2021)   Received from [facility] - Occupational Stress Questionnaire    Feeling of Stress : Patient declined  Social Connections: Unknown (12/15/2021)   Received from [facility], [facility]   Social Network    Social Network: Not on file  Intimate Partner Violence: Unknown (11/06/2021)   Received from [facility], [facility]   HITS    Physically Hurt: Not on file    Insult or Talk Down To: Not on file    Threaten Physical Harm: Not on file    Scream or Curse: Not on file    PHYSICAL EXAM  GENERAL EXAM/CONSTITUTIONAL: Vitals:  Vitals:   09/15/23 0856  BP: (!) 143/61  Pulse: 80  Weight: 158 lb (71.7 kg)  Height: 5\' 9"  (1.753 m)   Body mass index is 23.33 kg/m. Wt Readings from Last 3 Encounters:  09/15/23 158 lb (71.7 kg)  09/09/22 160 lb (72.6 kg)  07/01/22 160 lb (72.6 kg)   Patient is in no distress; well developed, nourished and groomed; neck is supple  MUSCULOSKELETAL: Gait, strength, tone, movements noted in Neurologic exam below  NEUROLOGIC: MENTAL STATUS:      No data to display         awake, alert, oriented to person, place and time. Know the current president but not the previous, able to count the number of quarter in $1.75 language fluent, comprehension intact, naming intact     09/09/2022    3:46 PM  [facility] Cognitive Assessment   Visuospatial/  Executive (0/5) 0  Naming (0/3) 2  Attention: Read list of digits (0/2) 1  Attention: Read list of letters (0/1) 1  Attention: Serial 7 subtraction starting at 100 (0/3) 1  Language: Repeat phrase (0/2) 0  Language : Fluency (0/1) 1  Abstraction (0/2) 2  Delayed Recall (0/5) 0  Orientation (0/6) 2  Total 10      CRANIAL NERVE:  2nd, 3rd, 4th, 6th - visual fields full to confrontation, extraocular muscles intact, no nystagmus 5th - facial sensation symmetric 7th - facial strength symmetric 8th - hearing intact 9th - palate elevates symmetrically, uvula midline 11th - shoulder shrug symmetric 12th - tongue protrusion midline  MOTOR:  normal bulk and tone, full strength in the BUE, BLE  SENSORY:  normal and symmetric to light touch  COORDINATION:  finger-nose-finger, fine finger movements normal  GAIT/STATION:  normal   DIAGNOSTIC DATA (LABS, IMAGING, TESTING) - I reviewed patient records, labs, notes, testing and imaging myself where available.  Lab Results  Component Value Date   WBC 9.4 05/03/2022   HGB 14.3 05/03/2022   HCT 41.1 05/03/2022   MCV 89.0 05/03/2022   PLT 276 05/03/2022      Component Value Date/Time   NA 124 (L) 05/03/2022 1749   K 4.2 05/03/2022 1749   CL 93 (L) 05/03/2022 1749   CO2 24 05/03/2022 1749   GLUCOSE 156 (H) 05/03/2022 1749   BUN 16 05/03/2022 1749   CREATININE 0.93 05/03/2022 1749   CALCIUM 8.2 (L) 05/03/2022 1749   PROT 6.5 05/03/2022 1749   ALBUMIN 4.0 05/03/2022 1749   AST 25 05/03/2022 1749   ALT 21 05/03/2022 1749   ALKPHOS 60 05/03/2022 1749   BILITOT 1.0 05/03/2022 1749   GFRNONAA >60 05/03/2022 1749   GFRAA  07/14/2009 1855    >60        The eGFR has been calculated using  the MDRD equation. This calculation has not been validated in all clinical situations. eGFR's persistently <60 mL/min signify possible Chronic Kidney Disease.   No results found for: "CHOL", "HDL", "LDLCALC", "LDLDIRECT", "TRIG",  "CHOLHDL" Lab Results  Component Value Date   HGBA1C 7.0 (H) 07/23/2021   Lab Results  Component Value Date   VITAMINB12 747 09/09/2022   Lab Results  Component Value Date   TSH 2.140 09/09/2022   ATN profile consistent with presence of Alzheimer disease biomarker.   MRI Brain 05/03/2022 1. No acute or reversible finding. 2. Brain atrophy with extensive chronic small vessel ischemia and signs of amyloid angiopathy   ASSESSMENT AND PLAN  78 y.o. year old male with history of diabetes mellitus type 2, hyperlipidemia, who is presenting for follow-up for his mild Alzheimer disease.  He is doing well on Exelon 1.5 mg twice daily but did have 2 syncopal episodes, the second 1 concerning for seizures due to grunting noise, tiredness and needing to rest the whole left afternoon.  I will proceed with routine EEG and will contact the patient with result.  I did advise him to follow-up with PCP and discuss these events, he might need cardiology referral and heart monitor.  I will see him in 1 year for follow-up but they understand to contact me sooner if his symptoms do get worse.   1. Mild late onset Alzheimer's dementia without behavioral disturbance, psychotic disturbance, mood disturbance, or anxiety (HCC)   2. Syncope, unspecified syncope type      Patient Instructions  Routine EEG, I will contact you to go over the results Continue current medications, will give him refill for Exelon Please follow-up with your doctor and discussed the 2 syncopal episodes, you might need cardiology referral Return in 1 year or sooner if worse.   Orders Placed This Encounter  Procedures   EEG adult    Meds ordered this encounter  Medications   rivastigmine (EXELON) 1.5 MG capsule    Sig: Take 1 capsule (1.5 mg total) by mouth 2 (two) times daily.    Dispense:  180 capsule    Refill:  3    Return in about 1 year (around 09/14/2024).   Windell Norfolk, MD 09/15/2023, 9:35 AM  [facility] [address] [place], [place] [address] [phone]

## 2023-09-15 NOTE — Patient Instructions (Addendum)
Routine EEG, I will contact you to go over the results Continue current medications, will give him refill for Exelon Please follow-up with your doctor and discussed the 2 syncopal episodes, you might need cardiology referral Return in 1 year or sooner if worse.

## 2023-09-23 DIAGNOSIS — R55 Syncope and collapse: Secondary | ICD-10-CM | POA: Diagnosis not present

## 2023-09-23 DIAGNOSIS — I10 Essential (primary) hypertension: Secondary | ICD-10-CM | POA: Diagnosis not present

## 2023-09-23 DIAGNOSIS — R413 Other amnesia: Secondary | ICD-10-CM | POA: Diagnosis not present

## 2023-09-23 DIAGNOSIS — E871 Hypo-osmolality and hyponatremia: Secondary | ICD-10-CM | POA: Diagnosis not present

## 2023-09-23 DIAGNOSIS — Z9641 Presence of insulin pump (external) (internal): Secondary | ICD-10-CM | POA: Diagnosis not present

## 2023-09-23 DIAGNOSIS — E7800 Pure hypercholesterolemia, unspecified: Secondary | ICD-10-CM | POA: Diagnosis not present

## 2023-09-23 DIAGNOSIS — G629 Polyneuropathy, unspecified: Secondary | ICD-10-CM | POA: Diagnosis not present

## 2023-09-23 DIAGNOSIS — E109 Type 1 diabetes mellitus without complications: Secondary | ICD-10-CM | POA: Diagnosis not present

## 2023-09-29 ENCOUNTER — Ambulatory Visit: Payer: PPO | Admitting: Neurology

## 2023-09-29 DIAGNOSIS — R55 Syncope and collapse: Secondary | ICD-10-CM

## 2023-09-29 NOTE — Procedures (Signed)
   History:  78 year old man with syncope  EEG classification:  Awake and asleep  Duration: 25 minutes   Technical aspects: This EEG study was done with scalp electrodes positioned according to the 10-20 International system of electrode placement. Electrical activity was reviewed with band pass filter of 1-70Hz , sensitivity of 7 uV/mm, display speed of 70mm/sec with a 60Hz  notched filter applied as appropriate. EEG data were recorded continuously and digitally stored.   Description of the recording: The background rhythms of this recording consists of a fairly well modulated medium amplitude background activity of 8 Hz. As the record progresses, the patient initially is in the waking state, but appears to enter the early stage II sleep during the recording, with rudimentary sleep spindles and vertex sharp wave activity seen. During the wakeful state, photic stimulation was performed, and no abnormal responses were seen. Hyperventilation was also performed, no abnormal response seen. No epileptiform discharges seen during this recording. There was continuous left hemispheric slowing.   Abnormality: Left hemispheric slowing   Impression: This is an abnormal awake and sleep EEG due to left hemispheric slowing. This is consistent with area of neuronal dysfunction in the left hemisphere.   Scott Norfolk, MD [facility]

## 2023-09-30 ENCOUNTER — Encounter: Payer: Self-pay | Admitting: Neurology

## 2023-10-07 DIAGNOSIS — Z794 Long term (current) use of insulin: Secondary | ICD-10-CM | POA: Diagnosis not present

## 2023-10-07 DIAGNOSIS — Z9641 Presence of insulin pump (external) (internal): Secondary | ICD-10-CM | POA: Diagnosis not present

## 2023-10-07 DIAGNOSIS — E109 Type 1 diabetes mellitus without complications: Secondary | ICD-10-CM | POA: Diagnosis not present

## 2023-10-11 DIAGNOSIS — E1065 Type 1 diabetes mellitus with hyperglycemia: Secondary | ICD-10-CM | POA: Diagnosis not present

## 2023-11-10 DIAGNOSIS — B351 Tinea unguium: Secondary | ICD-10-CM | POA: Diagnosis not present

## 2023-11-10 DIAGNOSIS — E1142 Type 2 diabetes mellitus with diabetic polyneuropathy: Secondary | ICD-10-CM | POA: Diagnosis not present

## 2023-11-11 DIAGNOSIS — Z794 Long term (current) use of insulin: Secondary | ICD-10-CM | POA: Diagnosis not present

## 2023-11-11 DIAGNOSIS — Z9641 Presence of insulin pump (external) (internal): Secondary | ICD-10-CM | POA: Diagnosis not present

## 2023-11-11 DIAGNOSIS — E109 Type 1 diabetes mellitus without complications: Secondary | ICD-10-CM | POA: Diagnosis not present

## 2023-11-11 DIAGNOSIS — E1065 Type 1 diabetes mellitus with hyperglycemia: Secondary | ICD-10-CM | POA: Diagnosis not present

## 2023-12-11 DIAGNOSIS — Z794 Long term (current) use of insulin: Secondary | ICD-10-CM | POA: Diagnosis not present

## 2023-12-11 DIAGNOSIS — E109 Type 1 diabetes mellitus without complications: Secondary | ICD-10-CM | POA: Diagnosis not present

## 2023-12-11 DIAGNOSIS — E1065 Type 1 diabetes mellitus with hyperglycemia: Secondary | ICD-10-CM | POA: Diagnosis not present

## 2023-12-11 DIAGNOSIS — Z9641 Presence of insulin pump (external) (internal): Secondary | ICD-10-CM | POA: Diagnosis not present

## 2023-12-20 DIAGNOSIS — R413 Other amnesia: Secondary | ICD-10-CM | POA: Diagnosis not present

## 2023-12-20 DIAGNOSIS — Z9641 Presence of insulin pump (external) (internal): Secondary | ICD-10-CM | POA: Diagnosis not present

## 2023-12-20 DIAGNOSIS — E871 Hypo-osmolality and hyponatremia: Secondary | ICD-10-CM | POA: Diagnosis not present

## 2023-12-20 DIAGNOSIS — E7800 Pure hypercholesterolemia, unspecified: Secondary | ICD-10-CM | POA: Diagnosis not present

## 2023-12-20 DIAGNOSIS — I10 Essential (primary) hypertension: Secondary | ICD-10-CM | POA: Diagnosis not present

## 2023-12-20 DIAGNOSIS — G629 Polyneuropathy, unspecified: Secondary | ICD-10-CM | POA: Diagnosis not present

## 2023-12-20 DIAGNOSIS — E109 Type 1 diabetes mellitus without complications: Secondary | ICD-10-CM | POA: Diagnosis not present

## 2023-12-20 DIAGNOSIS — R55 Syncope and collapse: Secondary | ICD-10-CM | POA: Diagnosis not present

## 2024-01-04 ENCOUNTER — Telehealth: Payer: Self-pay | Admitting: Neurology

## 2024-01-04 NOTE — Telephone Encounter (Signed)
 Call to patients wife, she reports on may 7 he passed out while outside. She reported him passing out again today while outside pulling weeds and was unresponsive and then his legs began to shake. She reports his sugar being 146. She is not sure if he is dehydrated or overheated or if they are contributing factors. Reviewed Dr. Samara Crest last note in regards to low dose keppra if event happened again and advised would send to Dr. Samara Crest for recommendations

## 2024-01-04 NOTE — Telephone Encounter (Signed)
 These events only occur while he is outside doing work, Will hold starting medication. Encourage increase fluid intake and consider cardiac monitoring.

## 2024-01-04 NOTE — Telephone Encounter (Signed)
 Wife reports pt has passed out on 2025/05/17and again today, she would like a call from RN to discuss.

## 2024-01-05 NOTE — Telephone Encounter (Signed)
 Call to patients wife, reviewed Dr. Elspeth Hals instructions and recommendations. Reviewed working outside in early moring or late afternoon, avoiding heat of day times, increasing fluid intake and electrolytes. Monitoring sugar intake. Wife verbalized understanding. Advised to keep us  updated on how patient is doing and future events.

## 2024-01-17 DIAGNOSIS — H40013 Open angle with borderline findings, low risk, bilateral: Secondary | ICD-10-CM | POA: Diagnosis not present

## 2024-01-17 DIAGNOSIS — H2513 Age-related nuclear cataract, bilateral: Secondary | ICD-10-CM | POA: Diagnosis not present

## 2024-01-17 DIAGNOSIS — H43811 Vitreous degeneration, right eye: Secondary | ICD-10-CM | POA: Diagnosis not present

## 2024-01-17 DIAGNOSIS — E119 Type 2 diabetes mellitus without complications: Secondary | ICD-10-CM | POA: Diagnosis not present

## 2024-01-18 DIAGNOSIS — Z794 Long term (current) use of insulin: Secondary | ICD-10-CM | POA: Diagnosis not present

## 2024-01-18 DIAGNOSIS — E1065 Type 1 diabetes mellitus with hyperglycemia: Secondary | ICD-10-CM | POA: Diagnosis not present

## 2024-01-18 DIAGNOSIS — Z9641 Presence of insulin pump (external) (internal): Secondary | ICD-10-CM | POA: Diagnosis not present

## 2024-01-18 DIAGNOSIS — E109 Type 1 diabetes mellitus without complications: Secondary | ICD-10-CM | POA: Diagnosis not present

## 2024-01-26 DIAGNOSIS — B351 Tinea unguium: Secondary | ICD-10-CM | POA: Diagnosis not present

## 2024-01-26 DIAGNOSIS — E1142 Type 2 diabetes mellitus with diabetic polyneuropathy: Secondary | ICD-10-CM | POA: Diagnosis not present

## 2024-02-17 DIAGNOSIS — E1065 Type 1 diabetes mellitus with hyperglycemia: Secondary | ICD-10-CM | POA: Diagnosis not present

## 2024-02-22 DIAGNOSIS — E1065 Type 1 diabetes mellitus with hyperglycemia: Secondary | ICD-10-CM | POA: Diagnosis not present

## 2024-03-05 DIAGNOSIS — E109 Type 1 diabetes mellitus without complications: Secondary | ICD-10-CM | POA: Diagnosis not present

## 2024-03-05 DIAGNOSIS — I10 Essential (primary) hypertension: Secondary | ICD-10-CM | POA: Diagnosis not present

## 2024-03-05 DIAGNOSIS — Z9641 Presence of insulin pump (external) (internal): Secondary | ICD-10-CM | POA: Diagnosis not present

## 2024-03-19 DIAGNOSIS — E1065 Type 1 diabetes mellitus with hyperglycemia: Secondary | ICD-10-CM | POA: Diagnosis not present

## 2024-03-21 DIAGNOSIS — R55 Syncope and collapse: Secondary | ICD-10-CM | POA: Diagnosis not present

## 2024-03-21 DIAGNOSIS — I10 Essential (primary) hypertension: Secondary | ICD-10-CM | POA: Diagnosis not present

## 2024-03-21 DIAGNOSIS — Z9641 Presence of insulin pump (external) (internal): Secondary | ICD-10-CM | POA: Diagnosis not present

## 2024-03-21 DIAGNOSIS — R413 Other amnesia: Secondary | ICD-10-CM | POA: Diagnosis not present

## 2024-03-21 DIAGNOSIS — E871 Hypo-osmolality and hyponatremia: Secondary | ICD-10-CM | POA: Diagnosis not present

## 2024-03-21 DIAGNOSIS — E109 Type 1 diabetes mellitus without complications: Secondary | ICD-10-CM | POA: Diagnosis not present

## 2024-03-21 DIAGNOSIS — E7800 Pure hypercholesterolemia, unspecified: Secondary | ICD-10-CM | POA: Diagnosis not present

## 2024-03-21 DIAGNOSIS — G629 Polyneuropathy, unspecified: Secondary | ICD-10-CM | POA: Diagnosis not present

## 2024-03-23 DIAGNOSIS — E1065 Type 1 diabetes mellitus with hyperglycemia: Secondary | ICD-10-CM | POA: Diagnosis not present

## 2024-03-24 DIAGNOSIS — E1065 Type 1 diabetes mellitus with hyperglycemia: Secondary | ICD-10-CM | POA: Diagnosis not present

## 2024-04-04 DIAGNOSIS — Z9641 Presence of insulin pump (external) (internal): Secondary | ICD-10-CM | POA: Diagnosis not present

## 2024-04-04 DIAGNOSIS — E871 Hypo-osmolality and hyponatremia: Secondary | ICD-10-CM | POA: Diagnosis not present

## 2024-04-04 DIAGNOSIS — E109 Type 1 diabetes mellitus without complications: Secondary | ICD-10-CM | POA: Diagnosis not present

## 2024-04-04 DIAGNOSIS — L720 Epidermal cyst: Secondary | ICD-10-CM | POA: Diagnosis not present

## 2024-04-04 DIAGNOSIS — Z6824 Body mass index (BMI) 24.0-24.9, adult: Secondary | ICD-10-CM | POA: Diagnosis not present

## 2024-04-04 DIAGNOSIS — R55 Syncope and collapse: Secondary | ICD-10-CM | POA: Diagnosis not present

## 2024-04-04 DIAGNOSIS — R413 Other amnesia: Secondary | ICD-10-CM | POA: Diagnosis not present

## 2024-04-04 DIAGNOSIS — I10 Essential (primary) hypertension: Secondary | ICD-10-CM | POA: Diagnosis not present

## 2024-04-04 DIAGNOSIS — G629 Polyneuropathy, unspecified: Secondary | ICD-10-CM | POA: Diagnosis not present

## 2024-04-04 DIAGNOSIS — E7800 Pure hypercholesterolemia, unspecified: Secondary | ICD-10-CM | POA: Diagnosis not present

## 2024-04-12 DIAGNOSIS — H905 Unspecified sensorineural hearing loss: Secondary | ICD-10-CM | POA: Diagnosis not present

## 2024-04-17 DIAGNOSIS — E1142 Type 2 diabetes mellitus with diabetic polyneuropathy: Secondary | ICD-10-CM | POA: Diagnosis not present

## 2024-04-17 DIAGNOSIS — B351 Tinea unguium: Secondary | ICD-10-CM | POA: Diagnosis not present

## 2024-04-18 DIAGNOSIS — R55 Syncope and collapse: Secondary | ICD-10-CM | POA: Diagnosis not present

## 2024-04-18 DIAGNOSIS — I10 Essential (primary) hypertension: Secondary | ICD-10-CM | POA: Diagnosis not present

## 2024-04-18 DIAGNOSIS — Z9641 Presence of insulin pump (external) (internal): Secondary | ICD-10-CM | POA: Diagnosis not present

## 2024-04-18 DIAGNOSIS — E109 Type 1 diabetes mellitus without complications: Secondary | ICD-10-CM | POA: Diagnosis not present

## 2024-04-18 DIAGNOSIS — E871 Hypo-osmolality and hyponatremia: Secondary | ICD-10-CM | POA: Diagnosis not present

## 2024-04-18 DIAGNOSIS — R413 Other amnesia: Secondary | ICD-10-CM | POA: Diagnosis not present

## 2024-04-18 DIAGNOSIS — G629 Polyneuropathy, unspecified: Secondary | ICD-10-CM | POA: Diagnosis not present

## 2024-04-18 DIAGNOSIS — E7800 Pure hypercholesterolemia, unspecified: Secondary | ICD-10-CM | POA: Diagnosis not present

## 2024-04-19 DIAGNOSIS — E1169 Type 2 diabetes mellitus with other specified complication: Secondary | ICD-10-CM | POA: Diagnosis not present

## 2024-04-19 DIAGNOSIS — E854 Organ-limited amyloidosis: Secondary | ICD-10-CM | POA: Diagnosis not present

## 2024-04-19 DIAGNOSIS — Z794 Long term (current) use of insulin: Secondary | ICD-10-CM | POA: Diagnosis not present

## 2024-04-19 DIAGNOSIS — I680 Cerebral amyloid angiopathy: Secondary | ICD-10-CM | POA: Diagnosis not present

## 2024-04-19 DIAGNOSIS — Z7689 Persons encountering health services in other specified circumstances: Secondary | ICD-10-CM | POA: Diagnosis not present

## 2024-04-19 DIAGNOSIS — I10 Essential (primary) hypertension: Secondary | ICD-10-CM | POA: Diagnosis not present

## 2024-04-19 DIAGNOSIS — E785 Hyperlipidemia, unspecified: Secondary | ICD-10-CM | POA: Diagnosis not present

## 2024-04-19 DIAGNOSIS — Z79899 Other long term (current) drug therapy: Secondary | ICD-10-CM | POA: Diagnosis not present

## 2024-04-24 DIAGNOSIS — E1065 Type 1 diabetes mellitus with hyperglycemia: Secondary | ICD-10-CM | POA: Diagnosis not present

## 2024-04-30 DIAGNOSIS — E1065 Type 1 diabetes mellitus with hyperglycemia: Secondary | ICD-10-CM | POA: Diagnosis not present

## 2024-04-30 DIAGNOSIS — L720 Epidermal cyst: Secondary | ICD-10-CM | POA: Diagnosis not present

## 2024-04-30 DIAGNOSIS — L820 Inflamed seborrheic keratosis: Secondary | ICD-10-CM | POA: Diagnosis not present

## 2024-04-30 DIAGNOSIS — L821 Other seborrheic keratosis: Secondary | ICD-10-CM | POA: Diagnosis not present

## 2024-05-08 DIAGNOSIS — H905 Unspecified sensorineural hearing loss: Secondary | ICD-10-CM | POA: Diagnosis not present

## 2024-05-22 DIAGNOSIS — E1065 Type 1 diabetes mellitus with hyperglycemia: Secondary | ICD-10-CM | POA: Diagnosis not present

## 2024-05-24 DIAGNOSIS — E1065 Type 1 diabetes mellitus with hyperglycemia: Secondary | ICD-10-CM | POA: Diagnosis not present

## 2024-05-30 DIAGNOSIS — E1065 Type 1 diabetes mellitus with hyperglycemia: Secondary | ICD-10-CM | POA: Diagnosis not present

## 2024-06-19 DIAGNOSIS — Z9641 Presence of insulin pump (external) (internal): Secondary | ICD-10-CM | POA: Diagnosis not present

## 2024-06-19 DIAGNOSIS — R634 Abnormal weight loss: Secondary | ICD-10-CM | POA: Diagnosis not present

## 2024-06-19 DIAGNOSIS — E109 Type 1 diabetes mellitus without complications: Secondary | ICD-10-CM | POA: Diagnosis not present

## 2024-06-19 DIAGNOSIS — I10 Essential (primary) hypertension: Secondary | ICD-10-CM | POA: Diagnosis not present

## 2024-06-19 DIAGNOSIS — E7800 Pure hypercholesterolemia, unspecified: Secondary | ICD-10-CM | POA: Diagnosis not present

## 2024-06-19 DIAGNOSIS — E871 Hypo-osmolality and hyponatremia: Secondary | ICD-10-CM | POA: Diagnosis not present

## 2024-06-24 DIAGNOSIS — E1065 Type 1 diabetes mellitus with hyperglycemia: Secondary | ICD-10-CM | POA: Diagnosis not present

## 2024-06-29 DIAGNOSIS — E1065 Type 1 diabetes mellitus with hyperglycemia: Secondary | ICD-10-CM | POA: Diagnosis not present

## 2024-09-13 ENCOUNTER — Ambulatory Visit: Payer: PPO | Admitting: Neurology
